# Patient Record
Sex: Male | Born: 1945 | Race: Black or African American | Hispanic: No | Marital: Married | Smoking: Never smoker
Health system: Southern US, Community
[De-identification: ages and names within clinical notes are randomized; demographics above are authoritative.]

## PROBLEM LIST (undated history)

## (undated) DIAGNOSIS — I10 Essential (primary) hypertension: Secondary | ICD-10-CM

---

## 2001-10-29 ENCOUNTER — Ambulatory Visit (HOSPITAL_COMMUNITY): Admission: RE | Admit: 2001-10-29 | Discharge: 2001-10-29 | Payer: Self-pay | Admitting: Internal Medicine

## 2001-10-29 ENCOUNTER — Encounter: Payer: Self-pay | Admitting: Internal Medicine

## 2002-01-07 ENCOUNTER — Ambulatory Visit (HOSPITAL_COMMUNITY): Admission: RE | Admit: 2002-01-07 | Discharge: 2002-01-07 | Payer: Self-pay | Admitting: Specialist

## 2002-02-23 ENCOUNTER — Ambulatory Visit (HOSPITAL_COMMUNITY): Admission: RE | Admit: 2002-02-23 | Discharge: 2002-02-23 | Payer: Self-pay | Admitting: Specialist

## 2012-05-23 ENCOUNTER — Encounter (HOSPITAL_COMMUNITY): Payer: Self-pay | Admitting: *Deleted

## 2012-05-23 ENCOUNTER — Emergency Department (HOSPITAL_COMMUNITY)
Admission: EM | Admit: 2012-05-23 | Discharge: 2012-05-23 | Disposition: A | Discharge status: Home / Self Care | Payer: BC Managed Care – PPO | Attending: Emergency Medicine | Admitting: Emergency Medicine

## 2012-05-23 DIAGNOSIS — M109 Gout, unspecified: Secondary | ICD-10-CM

## 2012-05-23 DIAGNOSIS — M79609 Pain in unspecified limb: Secondary | ICD-10-CM | POA: Insufficient documentation

## 2012-05-23 HISTORY — DX: Essential (primary) hypertension: I10

## 2012-05-23 MED ORDER — PREDNISONE 20 MG PO TABS: 20.0000 mg | ORAL_TABLET | Freq: Two times a day (BID) | ORAL | Status: DC

## 2012-05-23 MED ORDER — PREDNISONE 20 MG PO TABS
60.0000 mg | ORAL_TABLET | Freq: Once | ORAL | Status: AC
  Administered 2012-05-23: 60 mg via ORAL
  Filled 2012-05-23: qty 3

## 2012-05-23 NOTE — ED Notes (Signed)
C/o lt great toe pain for 2 days.  No known injury

## 2012-05-23 NOTE — ED Provider Notes (Signed)
History   This chart was scribed for No att. providers found by Toya Smothers. The patient was seen in room TR10C/TR10C. Patient's care was started at 1751.  CSN: [id number]  Arrival date & time 05/23/12  1751   None     Chief Complaint  Patient presents with  . Toe Pain   The history is provided by the patient. No language interpreter was used.    CULVER FEIGHNER is a 66 y.o. male with a h/oHTN who presents to the Emergency Department complaining of 2 days of sever constant sharp great right toe pain without injury mechanism. Pain is aggravated with ambulation and palpation, while alleviated by nothing. Pt denotes associate moderate swelling. No foreign body present. No obvious deformity. PTA pt has treated symptoms with cold and hot compress providing mild relief. Pt denies chest pain, chest tightness, dizziness, HA, and fever. Pt admits the use of alcohol and denies the use of tobacco products.   Past Medical History  Diagnosis Date  . Hypertension    History reviewed. No pertinent past surgical history.  No family history on file.  History  Substance Use Topics  . Smoking status: Never Smoker   . Smokeless tobacco: Not on file  . Alcohol Use: Yes   Review of Systems  Musculoskeletal:       Extremity pain  All other systems reviewed and are negative.    Allergies  Review of patient's allergies indicates no known allergies.  Home Medications   Current Outpatient Rx  Name Route Sig Dispense Refill  . OMEGA-3 FATTY ACIDS 1000 MG PO CAPS Oral Take 1 g by mouth daily.    Marland Kitchen LISINOPRIL 20 MG PO TABS Oral Take 20 mg by mouth 2 (two) times daily.    Marland Kitchen PREDNISONE 20 MG PO TABS Oral Take 1 tablet (20 mg total) by mouth 2 (two) times daily. 10 tablet 0    BP 140/81  Pulse 106  Temp 99.1 F (37.3 C) (Oral)  Resp 18  SpO2 96%  Physical Exam  Nursing note and vitals reviewed. Constitutional: He is oriented to person, place, and time. He appears well-developed and  well-nourished. No distress.  HENT:  Head: Normocephalic and atraumatic.  Eyes: Conjunctivae normal and EOM are normal.  Neck: Neck supple. No tracheal deviation present.  Cardiovascular: Normal rate.   Pulmonary/Chest: Effort normal. No respiratory distress.  Abdominal: He exhibits no distension.  Musculoskeletal: Normal range of motion.  Neurological: He is alert and oriented to person, place, and time. No sensory deficit.  Skin: Skin is dry.  Psychiatric: He has a normal mood and affect. His behavior is normal.    ED Course  Procedures 19:28- Evaluated Pt. Pt is awake, alert, and oriented. 19:33- Patient informed of clinical course, understand medical decision-making process, and agree with plan. Plan: Home Medications- Perdnisone ; Home Treatments- cold compress and follow dietary regulations; Recommended follow up- with PCP to discuss long term treatment plan.   Labs Reviewed - No data to display No results found.   1. Gout       MDM  Evaluation is consistent with gout. Doubt septic arthritis, cellulitis, or lymphangitis.     I personally performed the services described in this documentation, which was scribed in my presence. The recorded information has been reviewed and considered.      Flint Melter, MD 05/29/12 [phone]

## 2020-09-09 DIAGNOSIS — I10 Essential (primary) hypertension: Secondary | ICD-10-CM | POA: Diagnosis not present

## 2020-09-09 DIAGNOSIS — E782 Mixed hyperlipidemia: Secondary | ICD-10-CM | POA: Diagnosis not present

## 2020-09-09 DIAGNOSIS — E119 Type 2 diabetes mellitus without complications: Secondary | ICD-10-CM | POA: Diagnosis not present

## 2020-09-09 DIAGNOSIS — E1169 Type 2 diabetes mellitus with other specified complication: Secondary | ICD-10-CM | POA: Diagnosis not present

## 2020-10-19 DIAGNOSIS — E119 Type 2 diabetes mellitus without complications: Secondary | ICD-10-CM | POA: Diagnosis not present

## 2020-10-19 DIAGNOSIS — E1169 Type 2 diabetes mellitus with other specified complication: Secondary | ICD-10-CM | POA: Diagnosis not present

## 2020-10-19 DIAGNOSIS — I10 Essential (primary) hypertension: Secondary | ICD-10-CM | POA: Diagnosis not present

## 2020-10-19 DIAGNOSIS — E782 Mixed hyperlipidemia: Secondary | ICD-10-CM | POA: Diagnosis not present

## 2020-11-30 DIAGNOSIS — E782 Mixed hyperlipidemia: Secondary | ICD-10-CM | POA: Diagnosis not present

## 2020-11-30 DIAGNOSIS — I10 Essential (primary) hypertension: Secondary | ICD-10-CM | POA: Diagnosis not present

## 2020-11-30 DIAGNOSIS — E119 Type 2 diabetes mellitus without complications: Secondary | ICD-10-CM | POA: Diagnosis not present

## 2020-11-30 DIAGNOSIS — E1169 Type 2 diabetes mellitus with other specified complication: Secondary | ICD-10-CM | POA: Diagnosis not present

## 2020-12-20 DIAGNOSIS — E782 Mixed hyperlipidemia: Secondary | ICD-10-CM | POA: Diagnosis not present

## 2020-12-20 DIAGNOSIS — Z0000 Encounter for general adult medical examination without abnormal findings: Secondary | ICD-10-CM | POA: Diagnosis not present

## 2020-12-20 DIAGNOSIS — E1169 Type 2 diabetes mellitus with other specified complication: Secondary | ICD-10-CM | POA: Diagnosis not present

## 2020-12-20 DIAGNOSIS — M109 Gout, unspecified: Secondary | ICD-10-CM | POA: Diagnosis not present

## 2020-12-20 DIAGNOSIS — I10 Essential (primary) hypertension: Secondary | ICD-10-CM | POA: Diagnosis not present

## 2020-12-20 DIAGNOSIS — Z1389 Encounter for screening for other disorder: Secondary | ICD-10-CM | POA: Diagnosis not present

## 2021-01-03 DIAGNOSIS — I10 Essential (primary) hypertension: Secondary | ICD-10-CM | POA: Diagnosis not present

## 2021-01-27 DIAGNOSIS — E1169 Type 2 diabetes mellitus with other specified complication: Secondary | ICD-10-CM | POA: Diagnosis not present

## 2021-01-27 DIAGNOSIS — I10 Essential (primary) hypertension: Secondary | ICD-10-CM | POA: Diagnosis not present

## 2021-01-27 DIAGNOSIS — E119 Type 2 diabetes mellitus without complications: Secondary | ICD-10-CM | POA: Diagnosis not present

## 2021-01-27 DIAGNOSIS — E782 Mixed hyperlipidemia: Secondary | ICD-10-CM | POA: Diagnosis not present

## 2021-03-27 DIAGNOSIS — I10 Essential (primary) hypertension: Secondary | ICD-10-CM | POA: Diagnosis not present

## 2021-03-27 DIAGNOSIS — E1169 Type 2 diabetes mellitus with other specified complication: Secondary | ICD-10-CM | POA: Diagnosis not present

## 2021-03-27 DIAGNOSIS — M109 Gout, unspecified: Secondary | ICD-10-CM | POA: Diagnosis not present

## 2021-03-27 DIAGNOSIS — E782 Mixed hyperlipidemia: Secondary | ICD-10-CM | POA: Diagnosis not present

## 2021-04-03 DIAGNOSIS — E782 Mixed hyperlipidemia: Secondary | ICD-10-CM | POA: Diagnosis not present

## 2021-04-03 DIAGNOSIS — E1169 Type 2 diabetes mellitus with other specified complication: Secondary | ICD-10-CM | POA: Diagnosis not present

## 2021-04-03 DIAGNOSIS — E119 Type 2 diabetes mellitus without complications: Secondary | ICD-10-CM | POA: Diagnosis not present

## 2021-04-03 DIAGNOSIS — I10 Essential (primary) hypertension: Secondary | ICD-10-CM | POA: Diagnosis not present

## 2021-04-25 DIAGNOSIS — I10 Essential (primary) hypertension: Secondary | ICD-10-CM | POA: Diagnosis not present

## 2021-04-25 DIAGNOSIS — R059 Cough, unspecified: Secondary | ICD-10-CM | POA: Diagnosis not present

## 2021-04-25 DIAGNOSIS — R011 Cardiac murmur, unspecified: Secondary | ICD-10-CM | POA: Diagnosis not present

## 2021-04-26 ENCOUNTER — Other Ambulatory Visit: Payer: Self-pay | Admitting: Internal Medicine

## 2021-04-26 ENCOUNTER — Ambulatory Visit
Admission: RE | Admit: 2021-04-26 | Discharge: 2021-04-26 | Disposition: A | Discharge status: Home / Self Care | Payer: Self-pay | Source: Ambulatory Visit | Attending: Internal Medicine | Admitting: Internal Medicine

## 2021-04-26 DIAGNOSIS — R059 Cough, unspecified: Secondary | ICD-10-CM | POA: Diagnosis not present

## 2021-04-26 DIAGNOSIS — R0602 Shortness of breath: Secondary | ICD-10-CM | POA: Diagnosis not present

## 2021-04-26 IMAGING — DX DG CHEST 2V
2 series · 2 of 2 positions shown · non-contrast
Comparison: [DATE]
COMPARISON: [DATE].
COMPARISON: [DATE]

Addendum:
CLINICAL DATA: Cough, shortness of breath with exertion

EXAM:
CHEST - 2 VIEW
CLINICAL DATA: Cough, shortness of breath.
[UH] view

[dg chest 2 view (1 of 2)]
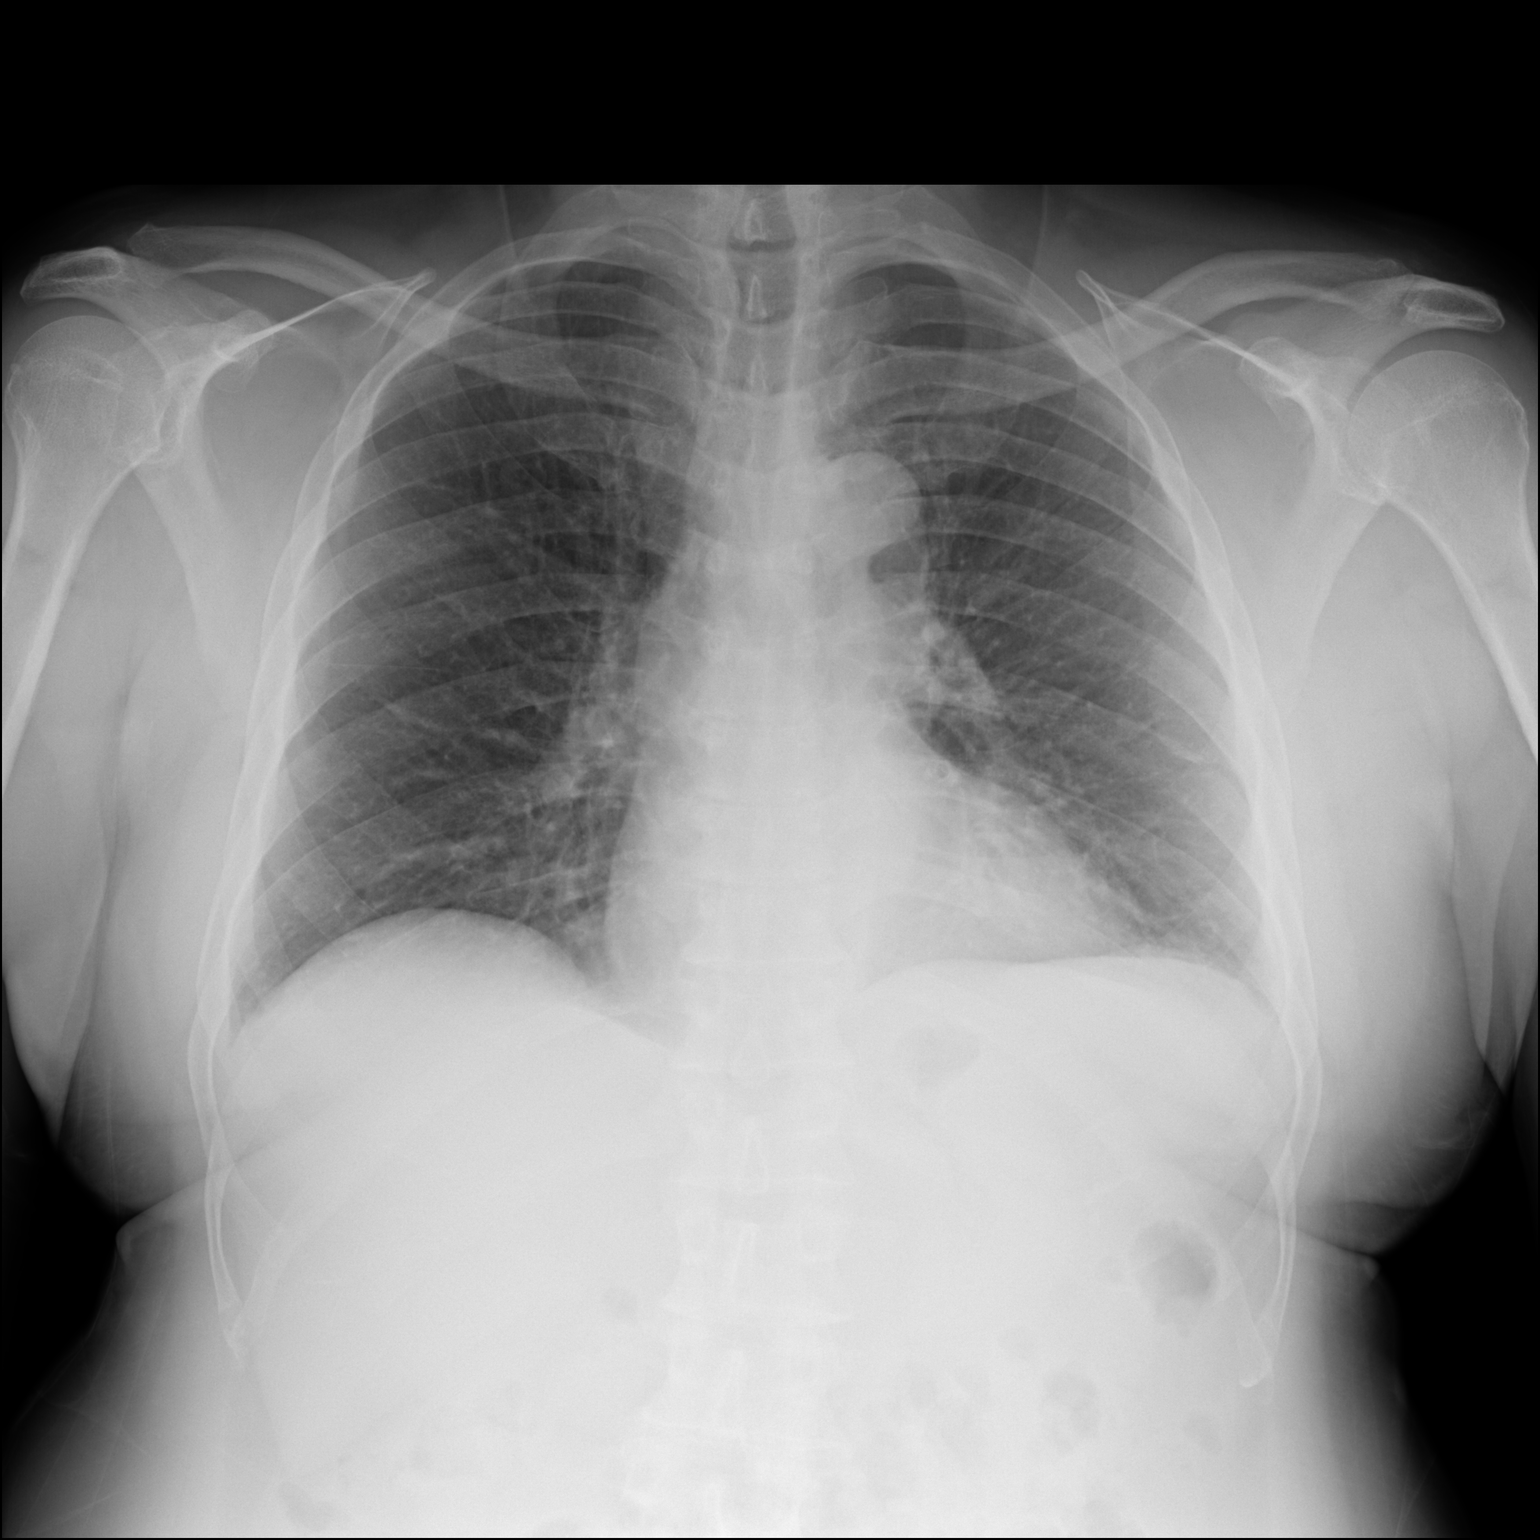

[dg chest 2 view (2 of 2)]
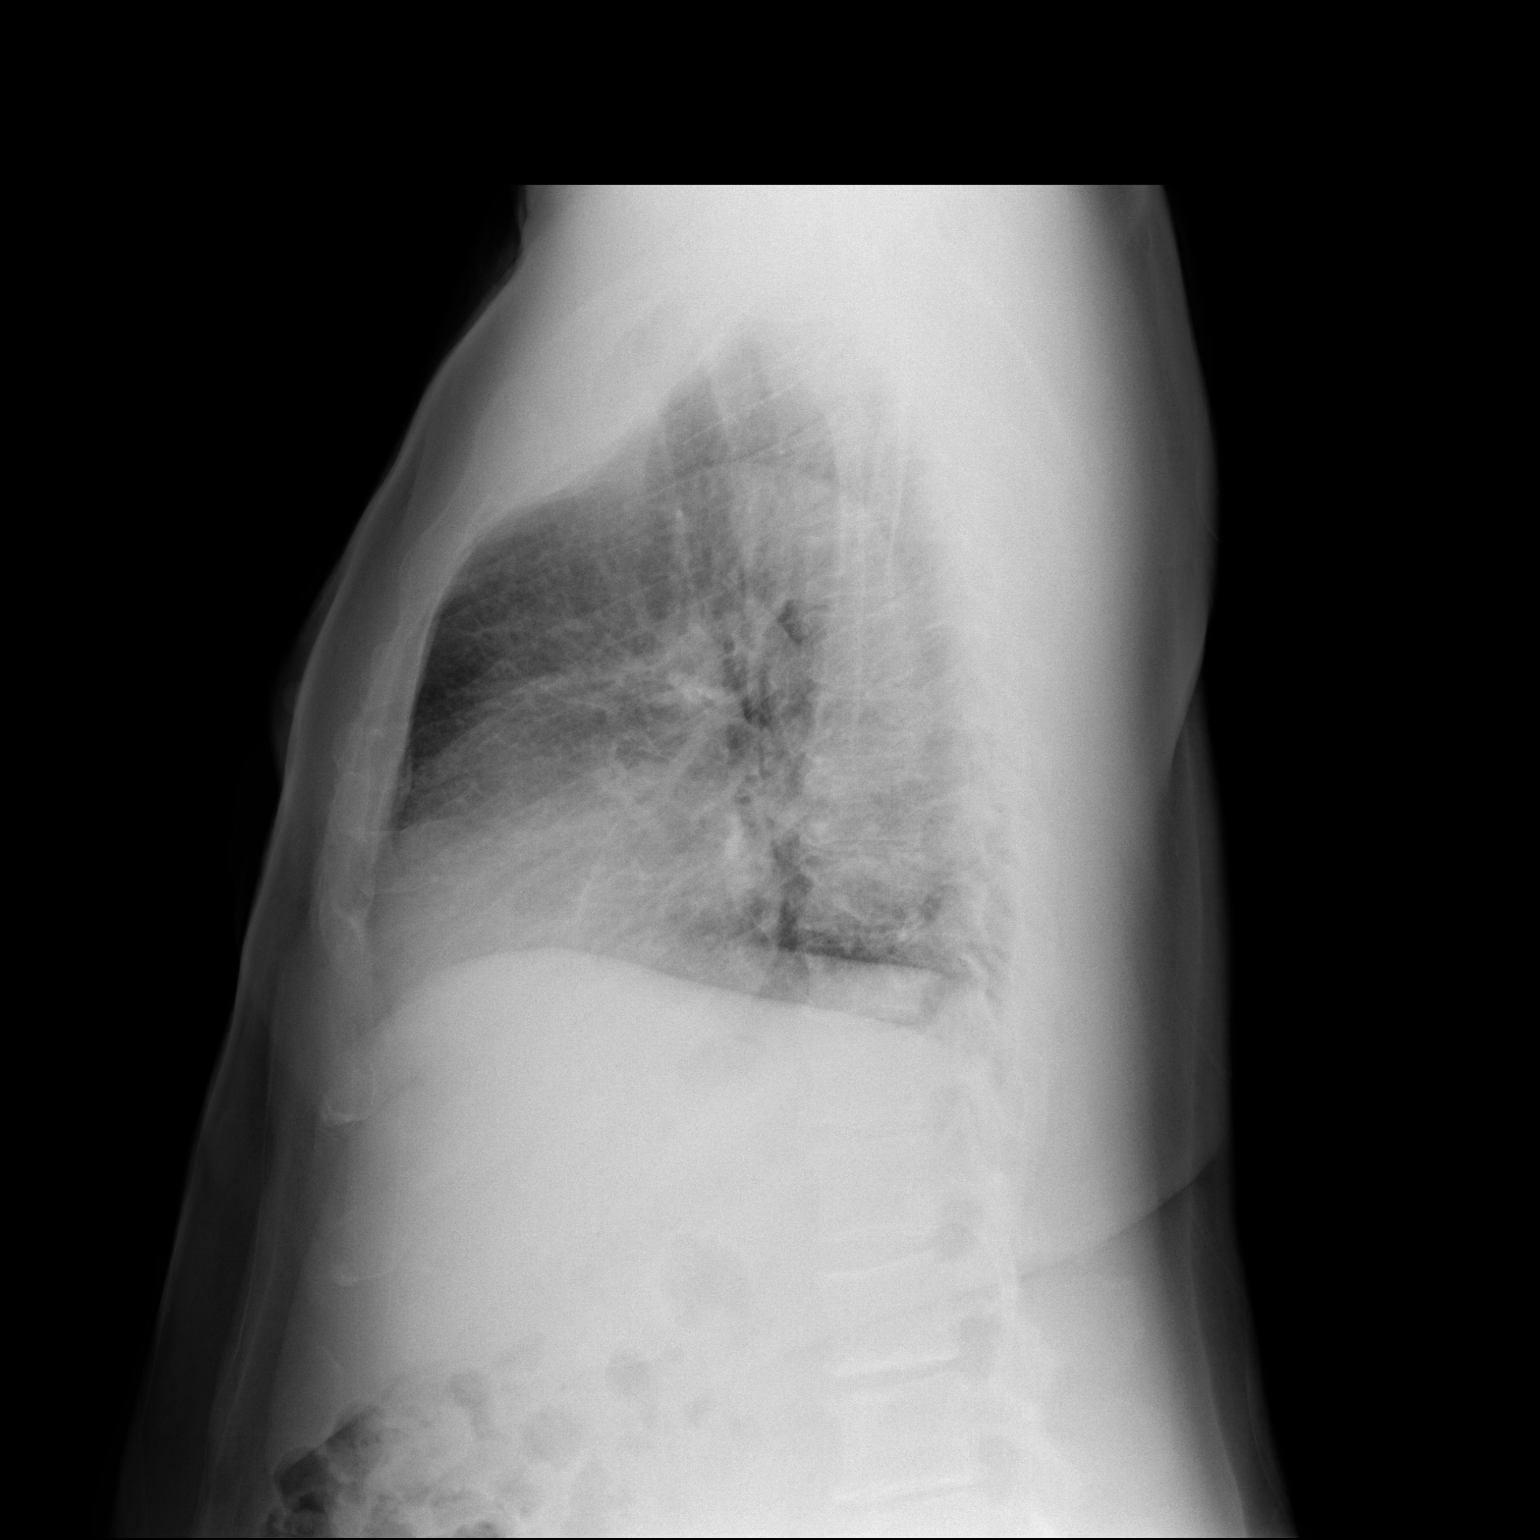

[2 of 2 positions shown; findings below may reference images not displayed]

FINDINGS: Mild cardiomegaly. Mild peribronchial thickening. No confluent
opacities or effusions. No acute bony abnormality.
IMPRESSION: Mild cardiomegaly.

Bronchitic changes.

ADDENDUM:
The original images on this report were for the wrong patient. The
correct patient and images are now included.
FINDINGS: Heart is normal size. No confluent airspace opacities or effusions.
No acute bony abnormality.
IMPRESSION: No acute cardiopulmonary disease.

*** End of Addendum ***
FINDINGS: Mild cardiomegaly. Mild peribronchial thickening. No confluent
opacities or effusions. No acute bony abnormality.
IMPRESSION: Mild cardiomegaly.

Bronchitic changes.

## 2021-04-28 DIAGNOSIS — R011 Cardiac murmur, unspecified: Secondary | ICD-10-CM | POA: Diagnosis not present

## 2021-06-05 DIAGNOSIS — I10 Essential (primary) hypertension: Secondary | ICD-10-CM | POA: Diagnosis not present

## 2021-06-05 DIAGNOSIS — E782 Mixed hyperlipidemia: Secondary | ICD-10-CM | POA: Diagnosis not present

## 2021-06-05 DIAGNOSIS — E119 Type 2 diabetes mellitus without complications: Secondary | ICD-10-CM | POA: Diagnosis not present

## 2021-06-05 DIAGNOSIS — E1169 Type 2 diabetes mellitus with other specified complication: Secondary | ICD-10-CM | POA: Diagnosis not present

## 2021-08-30 DIAGNOSIS — I739 Peripheral vascular disease, unspecified: Secondary | ICD-10-CM | POA: Diagnosis not present

## 2021-08-30 DIAGNOSIS — I10 Essential (primary) hypertension: Secondary | ICD-10-CM | POA: Diagnosis not present

## 2021-08-30 DIAGNOSIS — I350 Nonrheumatic aortic (valve) stenosis: Secondary | ICD-10-CM | POA: Diagnosis not present

## 2021-08-31 ENCOUNTER — Other Ambulatory Visit: Payer: Self-pay | Admitting: Internal Medicine

## 2021-08-31 DIAGNOSIS — I739 Peripheral vascular disease, unspecified: Secondary | ICD-10-CM

## 2021-09-06 DIAGNOSIS — Q231 Congenital insufficiency of aortic valve: Secondary | ICD-10-CM | POA: Diagnosis not present

## 2021-09-06 DIAGNOSIS — I10 Essential (primary) hypertension: Secondary | ICD-10-CM | POA: Diagnosis not present

## 2021-09-08 ENCOUNTER — Ambulatory Visit
Admission: RE | Admit: 2021-09-08 | Discharge: 2021-09-08 | Disposition: A | Discharge status: Home / Self Care | Payer: Medicare Other | Source: Ambulatory Visit | Attending: Internal Medicine | Admitting: Internal Medicine

## 2021-09-08 DIAGNOSIS — I739 Peripheral vascular disease, unspecified: Secondary | ICD-10-CM

## 2021-09-24 NOTE — Progress Notes (Signed)
Cardiology Office Note:    Date:  09/26/2021   ID:  Patrick Stevens, DOB May 21, 1946, MRN [id number]  PCP:  Georgann Housekeeper, MD  Cardiologist:  Little Ishikawa, MD  Electrophysiologist:  None   Referring MD: Georgann Housekeeper, MD   Chief Complaint  Patient presents with   Aortic Stenosis    History of Present Illness:    Patrick Stevens is a 76 y.o. male with a hx of hypertension who is referred by Dr. Donette Larry for evaluation of aortic stenosis/bicuspid aortic valve.  Had echo done last year for heart murmur, reportedly with bicuspid aortic valve and mild aortic stenosis.  Denies any chest pain, dyspnea, lightheadedness, syncope, or palpitations.  Does report some swelling in his legs when he is on his feet all day.  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  Denies any exertional symptoms.  No smoking history.  No known heart disease in his family.  He is originally from [place], moved to [place] in 1990.    Past Medical History:  Diagnosis Date   Hypertension     No past surgical history on file.  Current Medications: Current Meds  Medication Sig   aspirin EC 81 MG tablet Take 81 mg by mouth daily. Swallow whole.   indomethacin (INDOCIN) 50 MG capsule Take 50 mg by mouth 2 (two) times daily as needed (gout pain).   lovastatin (MEVACOR) 40 MG tablet Take 40 mg by mouth daily with supper.   metFORMIN (GLUCOPHAGE) 500 MG tablet Take 500 mg by mouth daily with supper.   valsartan-hydrochlorothiazide (DIOVAN-HCT) 160-12.5 MG tablet Take 1 tablet by mouth in the morning and at bedtime.     Allergies:   Patient has no known allergies.   Social History   Socioeconomic History   Marital status: Married    Spouse name: Not on file   Number of children: Not on file   Years of education: Not on file   Highest education level: Not on file  Occupational History   Not on file  Tobacco Use   Smoking status: Never   Smokeless tobacco: Not on  file  Substance and Sexual Activity   Alcohol use: Yes   Drug use: Not on file   Sexual activity: Not on file  Other Topics Concern   Not on file  Social History Narrative   Not on file   Social Determinants of Health   Financial Resource Strain: Not on file  Food Insecurity: Not on file  Transportation Needs: Not on file  Physical Activity: Not on file  Stress: Not on file  Social Connections: Not on file     Family History: The patient's family history is not on file.  ROS:   Please see the history of present illness.     All other systems reviewed and are negative.  EKGs/Labs/Other Studies Reviewed:    The following studies were reviewed today:   EKG:   09/26/2021: Normal sinus rhythm, rate 87, no ST abnormalities  Recent Labs: No results found for requested labs within last 8760 hours.  Recent Lipid Panel No results found for: CHOL, TRIG, HDL, CHOLHDL, VLDL, LDLCALC, LDLDIRECT  Physical Exam:    VS:  BP 130/60    Pulse 87    Ht 5\' 4"  (1.626 m)    Wt 178 lb (80.7 kg)    SpO2 99%    BMI 30.55 kg/m     Wt Readings from Last  3 Encounters:  09/26/21 178 lb (80.7 kg)     GEN:  Well nourished, well developed in no acute distress HEENT: Normal NECK: No JVD; No carotid bruits LYMPHATICS: No lymphadenopathy CARDIAC: RRR, 2 out of 6 systolic murmur  RESPIRATORY:  Clear to auscultation without rales, wheezing or rhonchi  ABDOMEN: Soft, non-tender, non-distended MUSCULOSKELETAL:  No edema; No deformity  SKIN: Warm and dry NEUROLOGIC:  Alert and oriented x 3 PSYCHIATRIC:  Normal affect   ASSESSMENT:    1. Aortic valve stenosis, etiology of cardiac valve disease unspecified   2. Essential hypertension   3. Hyperlipidemia, unspecified hyperlipidemia type   4. Prediabetes    PLAN:    Aortic stenosis: Echocardiogram done last year reportedly with bicuspid aortic valve and mild AS. -Repeat echocardiogram  Hypertension: On valsartan-HCTZ 160-12.5 mg daily.   Appears controlled  Hyperlipidemia: On lovastatin.  LDL 114 on 12/20/2020  Prediabetes: On metformin.  A1c 6.3% 12/20/2020  RTC in 6 months   Medication Adjustments/Labs and Tests Ordered: Current medicines are reviewed at length with the patient today.  Concerns regarding medicines are outlined above.  Orders Placed This Encounter  Procedures   EKG 12-Lead   ECHOCARDIOGRAM COMPLETE   No orders of the defined types were placed in this encounter.   Patient Instructions  Medication Instructions:  Your physician recommends that you continue on your current medications as directed. Please refer to the Current Medication list given to you today.  *If you need a refill on your cardiac medications before your next appointment, please call your pharmacy*   Lab Work: None If you have labs (blood work) drawn today and your tests are completely normal, you will receive your results only by: MyChart Message (if you have MyChart) OR A paper copy in the mail If you have any lab test that is abnormal or we need to change your treatment, we will call you to review the results.   Testing/Procedures: Your physician has requested that you have an echocardiogram. Echocardiography is a painless test that uses sound waves to create images of your heart. It provides your doctor with information about the size and shape of your heart and how well your hearts chambers and valves are working. This procedure takes approximately one hour. There are no restrictions for this procedure.    Follow-Up: At [facility], you and your health needs are our priority.  As part of our continuing mission to provide you with exceptional heart care, we have created designated Provider Care Teams.  These Care Teams include your primary Cardiologist (physician) and Advanced Practice Providers (APPs -  Physician Assistants and Nurse Practitioners) who all work together to provide you with the care you need, when you need  it.  We recommend signing up for the patient portal called "MyChart".  Sign up information is provided on this After Visit Summary.  MyChart is used to connect with patients for Virtual Visits (Telemedicine).  Patients are able to view lab/test results, encounter notes, upcoming appointments, etc.  Non-urgent messages can be sent to your provider as well.   To learn more about what you can do with MyChart, go to [email or web address].    Your next appointment:   6 month(s)  The format for your next appointment:   In Person  Provider:   Little Ishikawa, MD     Other Instructions     Signed, Little Ishikawa, MD  09/26/2021 8:41 AM    [facility]

## 2021-09-26 ENCOUNTER — Encounter: Payer: Self-pay | Admitting: Cardiology

## 2021-09-26 ENCOUNTER — Ambulatory Visit (INDEPENDENT_AMBULATORY_CARE_PROVIDER_SITE_OTHER): Payer: Medicare Other | Admitting: Cardiology

## 2021-09-26 ENCOUNTER — Other Ambulatory Visit: Payer: Self-pay

## 2021-09-26 VITALS — BP 130/60 | HR 87 | Ht 64.0 in | Wt 178.0 lb

## 2021-09-26 DIAGNOSIS — E785 Hyperlipidemia, unspecified: Secondary | ICD-10-CM

## 2021-09-26 DIAGNOSIS — R7303 Prediabetes: Secondary | ICD-10-CM | POA: Diagnosis not present

## 2021-09-26 DIAGNOSIS — I350 Nonrheumatic aortic (valve) stenosis: Secondary | ICD-10-CM

## 2021-09-26 DIAGNOSIS — I10 Essential (primary) hypertension: Secondary | ICD-10-CM | POA: Diagnosis not present

## 2021-09-26 NOTE — Patient Instructions (Signed)
Medication Instructions:  Your physician recommends that you continue on your current medications as directed. Please refer to the Current Medication list given to you today.  *If you need a refill on your cardiac medications before your next appointment, please call your pharmacy*   Lab Work: None If you have labs (blood work) drawn today and your tests are completely normal, you will receive your results only by: MyChart Message (if you have MyChart) OR A paper copy in the mail If you have any lab test that is abnormal or we need to change your treatment, we will call you to review the results.   Testing/Procedures: Your physician has requested that you have an echocardiogram. Echocardiography is a painless test that uses sound waves to create images of your heart. It provides your doctor with information about the size and shape of your heart and how well your hearts chambers and valves are working. This procedure takes approximately one hour. There are no restrictions for this procedure.    Follow-Up: At [facility], you and your health needs are our priority.  As part of our continuing mission to provide you with exceptional heart care, we have created designated Provider Care Teams.  These Care Teams include your primary Cardiologist (physician) and Advanced Practice Providers (APPs -  Physician Assistants and Nurse Practitioners) who all work together to provide you with the care you need, when you need it.  We recommend signing up for the patient portal called "MyChart".  Sign up information is provided on this After Visit Summary.  MyChart is used to connect with patients for Virtual Visits (Telemedicine).  Patients are able to view lab/test results, encounter notes, upcoming appointments, etc.  Non-urgent messages can be sent to your provider as well.   To learn more about what you can do with MyChart, go to [email or web address].    Your next appointment:   6 month(s)  The  format for your next appointment:   In Person  Provider:   Little Ishikawa, MD     Other Instructions

## 2021-10-06 DIAGNOSIS — I10 Essential (primary) hypertension: Secondary | ICD-10-CM | POA: Diagnosis not present

## 2021-10-06 DIAGNOSIS — I739 Peripheral vascular disease, unspecified: Secondary | ICD-10-CM | POA: Diagnosis not present

## 2021-10-11 ENCOUNTER — Ambulatory Visit (HOSPITAL_COMMUNITY): Payer: Medicare Other

## 2021-11-08 ENCOUNTER — Ambulatory Visit (HOSPITAL_COMMUNITY): Payer: Medicare Other | Attending: Internal Medicine

## 2021-11-08 ENCOUNTER — Encounter: Payer: Self-pay | Admitting: Cardiology

## 2021-11-08 NOTE — Progress Notes (Unsigned)
Patient ID: Patrick Stevens, male   DOB: [birth year], 76 y.o.   MRN: [id number] ? ? ?Verified appointment "no show" status with Wallace Cullens at 8:31am. ? ?

## 2021-11-29 DIAGNOSIS — M109 Gout, unspecified: Secondary | ICD-10-CM | POA: Diagnosis not present

## 2021-11-29 DIAGNOSIS — R21 Rash and other nonspecific skin eruption: Secondary | ICD-10-CM | POA: Diagnosis not present

## 2021-11-29 DIAGNOSIS — I10 Essential (primary) hypertension: Secondary | ICD-10-CM | POA: Diagnosis not present

## 2021-11-30 ENCOUNTER — Ambulatory Visit (HOSPITAL_COMMUNITY): Payer: Medicare Other | Attending: Cardiology

## 2021-11-30 ENCOUNTER — Encounter (HOSPITAL_COMMUNITY): Payer: Self-pay | Admitting: Cardiology

## 2022-01-11 DIAGNOSIS — E1122 Type 2 diabetes mellitus with diabetic chronic kidney disease: Secondary | ICD-10-CM | POA: Diagnosis not present

## 2022-01-11 DIAGNOSIS — I350 Nonrheumatic aortic (valve) stenosis: Secondary | ICD-10-CM | POA: Diagnosis not present

## 2022-01-11 DIAGNOSIS — I739 Peripheral vascular disease, unspecified: Secondary | ICD-10-CM | POA: Diagnosis not present

## 2022-01-11 DIAGNOSIS — M109 Gout, unspecified: Secondary | ICD-10-CM | POA: Diagnosis not present

## 2022-01-11 DIAGNOSIS — E782 Mixed hyperlipidemia: Secondary | ICD-10-CM | POA: Diagnosis not present

## 2022-01-11 DIAGNOSIS — I10 Essential (primary) hypertension: Secondary | ICD-10-CM | POA: Diagnosis not present

## 2022-01-11 DIAGNOSIS — N1831 Chronic kidney disease, stage 3a: Secondary | ICD-10-CM | POA: Diagnosis not present

## 2022-01-11 DIAGNOSIS — Z0000 Encounter for general adult medical examination without abnormal findings: Secondary | ICD-10-CM | POA: Diagnosis not present

## 2022-01-11 DIAGNOSIS — Q231 Congenital insufficiency of aortic valve: Secondary | ICD-10-CM | POA: Diagnosis not present

## 2022-06-28 DIAGNOSIS — M109 Gout, unspecified: Secondary | ICD-10-CM | POA: Diagnosis not present

## 2022-06-28 DIAGNOSIS — E1169 Type 2 diabetes mellitus with other specified complication: Secondary | ICD-10-CM | POA: Diagnosis not present

## 2022-08-17 DIAGNOSIS — I350 Nonrheumatic aortic (valve) stenosis: Secondary | ICD-10-CM | POA: Diagnosis not present

## 2022-08-17 DIAGNOSIS — E1122 Type 2 diabetes mellitus with diabetic chronic kidney disease: Secondary | ICD-10-CM | POA: Diagnosis not present

## 2022-08-17 DIAGNOSIS — I10 Essential (primary) hypertension: Secondary | ICD-10-CM | POA: Diagnosis not present

## 2022-08-17 DIAGNOSIS — M109 Gout, unspecified: Secondary | ICD-10-CM | POA: Diagnosis not present

## 2022-08-17 DIAGNOSIS — Q231 Congenital insufficiency of aortic valve: Secondary | ICD-10-CM | POA: Diagnosis not present

## 2022-08-17 DIAGNOSIS — N1831 Chronic kidney disease, stage 3a: Secondary | ICD-10-CM | POA: Diagnosis not present

## 2022-08-17 DIAGNOSIS — I739 Peripheral vascular disease, unspecified: Secondary | ICD-10-CM | POA: Diagnosis not present

## 2023-02-07 DIAGNOSIS — Z0000 Encounter for general adult medical examination without abnormal findings: Secondary | ICD-10-CM | POA: Diagnosis not present

## 2023-02-07 DIAGNOSIS — I350 Nonrheumatic aortic (valve) stenosis: Secondary | ICD-10-CM | POA: Diagnosis not present

## 2023-02-07 DIAGNOSIS — M109 Gout, unspecified: Secondary | ICD-10-CM | POA: Diagnosis not present

## 2023-02-07 DIAGNOSIS — E1151 Type 2 diabetes mellitus with diabetic peripheral angiopathy without gangrene: Secondary | ICD-10-CM | POA: Diagnosis not present

## 2023-02-07 DIAGNOSIS — E1122 Type 2 diabetes mellitus with diabetic chronic kidney disease: Secondary | ICD-10-CM | POA: Diagnosis not present

## 2023-02-07 DIAGNOSIS — Q231 Congenital insufficiency of aortic valve: Secondary | ICD-10-CM | POA: Diagnosis not present

## 2023-02-07 DIAGNOSIS — N182 Chronic kidney disease, stage 2 (mild): Secondary | ICD-10-CM | POA: Diagnosis not present

## 2023-02-07 DIAGNOSIS — I10 Essential (primary) hypertension: Secondary | ICD-10-CM | POA: Diagnosis not present

## 2023-02-07 DIAGNOSIS — E782 Mixed hyperlipidemia: Secondary | ICD-10-CM | POA: Diagnosis not present

## 2023-03-08 DIAGNOSIS — I10 Essential (primary) hypertension: Secondary | ICD-10-CM | POA: Diagnosis not present

## 2023-03-08 DIAGNOSIS — E1122 Type 2 diabetes mellitus with diabetic chronic kidney disease: Secondary | ICD-10-CM | POA: Diagnosis not present

## 2023-04-22 DIAGNOSIS — I10 Essential (primary) hypertension: Secondary | ICD-10-CM | POA: Diagnosis not present

## 2023-04-22 DIAGNOSIS — E119 Type 2 diabetes mellitus without complications: Secondary | ICD-10-CM | POA: Diagnosis not present

## 2023-05-28 DIAGNOSIS — U071 COVID-19: Secondary | ICD-10-CM | POA: Diagnosis not present

## 2023-05-28 DIAGNOSIS — E119 Type 2 diabetes mellitus without complications: Secondary | ICD-10-CM | POA: Diagnosis not present

## 2023-05-28 DIAGNOSIS — J069 Acute upper respiratory infection, unspecified: Secondary | ICD-10-CM | POA: Diagnosis not present

## 2023-12-19 DIAGNOSIS — E782 Mixed hyperlipidemia: Secondary | ICD-10-CM | POA: Diagnosis not present

## 2023-12-19 DIAGNOSIS — E1122 Type 2 diabetes mellitus with diabetic chronic kidney disease: Secondary | ICD-10-CM | POA: Diagnosis not present

## 2023-12-19 DIAGNOSIS — N1831 Chronic kidney disease, stage 3a: Secondary | ICD-10-CM | POA: Diagnosis not present

## 2023-12-19 DIAGNOSIS — I10 Essential (primary) hypertension: Secondary | ICD-10-CM | POA: Diagnosis not present

## 2023-12-19 DIAGNOSIS — Z0000 Encounter for general adult medical examination without abnormal findings: Secondary | ICD-10-CM | POA: Diagnosis not present

## 2023-12-19 DIAGNOSIS — I350 Nonrheumatic aortic (valve) stenosis: Secondary | ICD-10-CM | POA: Diagnosis not present

## 2023-12-19 DIAGNOSIS — Z23 Encounter for immunization: Secondary | ICD-10-CM | POA: Diagnosis not present

## 2023-12-19 DIAGNOSIS — M109 Gout, unspecified: Secondary | ICD-10-CM | POA: Diagnosis not present

## 2023-12-31 DIAGNOSIS — Z1211 Encounter for screening for malignant neoplasm of colon: Secondary | ICD-10-CM | POA: Diagnosis not present

## 2023-12-31 DIAGNOSIS — Z1212 Encounter for screening for malignant neoplasm of rectum: Secondary | ICD-10-CM | POA: Diagnosis not present

## 2024-01-11 DIAGNOSIS — I10 Essential (primary) hypertension: Secondary | ICD-10-CM | POA: Diagnosis not present

## 2024-01-11 DIAGNOSIS — I350 Nonrheumatic aortic (valve) stenosis: Secondary | ICD-10-CM | POA: Diagnosis not present

## 2024-01-11 DIAGNOSIS — E782 Mixed hyperlipidemia: Secondary | ICD-10-CM | POA: Diagnosis not present

## 2024-01-11 DIAGNOSIS — N182 Chronic kidney disease, stage 2 (mild): Secondary | ICD-10-CM | POA: Diagnosis not present

## 2024-01-29 DIAGNOSIS — I350 Nonrheumatic aortic (valve) stenosis: Secondary | ICD-10-CM | POA: Diagnosis not present

## 2024-02-10 DIAGNOSIS — E782 Mixed hyperlipidemia: Secondary | ICD-10-CM | POA: Diagnosis not present

## 2024-02-10 DIAGNOSIS — I10 Essential (primary) hypertension: Secondary | ICD-10-CM | POA: Diagnosis not present

## 2024-02-10 DIAGNOSIS — I350 Nonrheumatic aortic (valve) stenosis: Secondary | ICD-10-CM | POA: Diagnosis not present

## 2024-02-10 DIAGNOSIS — N182 Chronic kidney disease, stage 2 (mild): Secondary | ICD-10-CM | POA: Diagnosis not present

## 2024-03-12 DIAGNOSIS — I10 Essential (primary) hypertension: Secondary | ICD-10-CM | POA: Diagnosis not present

## 2024-03-12 DIAGNOSIS — M109 Gout, unspecified: Secondary | ICD-10-CM | POA: Diagnosis not present

## 2024-03-12 DIAGNOSIS — I350 Nonrheumatic aortic (valve) stenosis: Secondary | ICD-10-CM | POA: Diagnosis not present

## 2024-03-12 DIAGNOSIS — E782 Mixed hyperlipidemia: Secondary | ICD-10-CM | POA: Diagnosis not present

## 2024-03-12 DIAGNOSIS — N182 Chronic kidney disease, stage 2 (mild): Secondary | ICD-10-CM | POA: Diagnosis not present

## 2024-03-27 DIAGNOSIS — I10 Essential (primary) hypertension: Secondary | ICD-10-CM | POA: Diagnosis not present

## 2024-03-27 DIAGNOSIS — M109 Gout, unspecified: Secondary | ICD-10-CM | POA: Diagnosis not present

## 2024-04-07 DIAGNOSIS — M109 Gout, unspecified: Secondary | ICD-10-CM | POA: Diagnosis not present

## 2024-04-12 DIAGNOSIS — I10 Essential (primary) hypertension: Secondary | ICD-10-CM | POA: Diagnosis not present

## 2024-04-12 DIAGNOSIS — E782 Mixed hyperlipidemia: Secondary | ICD-10-CM | POA: Diagnosis not present

## 2024-04-12 DIAGNOSIS — I350 Nonrheumatic aortic (valve) stenosis: Secondary | ICD-10-CM | POA: Diagnosis not present

## 2024-04-12 DIAGNOSIS — N182 Chronic kidney disease, stage 2 (mild): Secondary | ICD-10-CM | POA: Diagnosis not present

## 2024-04-22 ENCOUNTER — Observation Stay (HOSPITAL_COMMUNITY)
Admission: EM | Admit: 2024-04-22 | Discharge: 2024-04-23 | Disposition: A | Discharge status: Home / Self Care | Attending: Internal Medicine | Admitting: Internal Medicine

## 2024-04-22 ENCOUNTER — Other Ambulatory Visit: Payer: Self-pay

## 2024-04-22 ENCOUNTER — Emergency Department (HOSPITAL_COMMUNITY)

## 2024-04-22 DIAGNOSIS — R5383 Other fatigue: Secondary | ICD-10-CM | POA: Diagnosis not present

## 2024-04-22 DIAGNOSIS — Z7984 Long term (current) use of oral hypoglycemic drugs: Secondary | ICD-10-CM | POA: Insufficient documentation

## 2024-04-22 DIAGNOSIS — E1165 Type 2 diabetes mellitus with hyperglycemia: Secondary | ICD-10-CM | POA: Diagnosis not present

## 2024-04-22 DIAGNOSIS — R011 Cardiac murmur, unspecified: Secondary | ICD-10-CM | POA: Diagnosis not present

## 2024-04-22 DIAGNOSIS — Z79899 Other long term (current) drug therapy: Secondary | ICD-10-CM | POA: Insufficient documentation

## 2024-04-22 DIAGNOSIS — R7989 Other specified abnormal findings of blood chemistry: Secondary | ICD-10-CM | POA: Insufficient documentation

## 2024-04-22 DIAGNOSIS — E1122 Type 2 diabetes mellitus with diabetic chronic kidney disease: Secondary | ICD-10-CM | POA: Diagnosis not present

## 2024-04-22 DIAGNOSIS — N179 Acute kidney failure, unspecified: Secondary | ICD-10-CM | POA: Diagnosis not present

## 2024-04-22 DIAGNOSIS — R778 Other specified abnormalities of plasma proteins: Secondary | ICD-10-CM | POA: Insufficient documentation

## 2024-04-22 DIAGNOSIS — Z7982 Long term (current) use of aspirin: Secondary | ICD-10-CM | POA: Diagnosis not present

## 2024-04-22 DIAGNOSIS — I10 Essential (primary) hypertension: Secondary | ICD-10-CM | POA: Diagnosis present

## 2024-04-22 DIAGNOSIS — Z794 Long term (current) use of insulin: Secondary | ICD-10-CM | POA: Insufficient documentation

## 2024-04-22 DIAGNOSIS — I129 Hypertensive chronic kidney disease with stage 1 through stage 4 chronic kidney disease, or unspecified chronic kidney disease: Secondary | ICD-10-CM | POA: Insufficient documentation

## 2024-04-22 DIAGNOSIS — R001 Bradycardia, unspecified: Secondary | ICD-10-CM | POA: Insufficient documentation

## 2024-04-22 DIAGNOSIS — N1831 Chronic kidney disease, stage 3a: Secondary | ICD-10-CM | POA: Insufficient documentation

## 2024-04-22 DIAGNOSIS — R350 Frequency of micturition: Secondary | ICD-10-CM | POA: Diagnosis not present

## 2024-04-22 DIAGNOSIS — R0602 Shortness of breath: Secondary | ICD-10-CM | POA: Insufficient documentation

## 2024-04-22 DIAGNOSIS — R42 Dizziness and giddiness: Secondary | ICD-10-CM | POA: Diagnosis not present

## 2024-04-22 DIAGNOSIS — E1100 Type 2 diabetes mellitus with hyperosmolarity without nonketotic hyperglycemic-hyperosmolar coma (NKHHC): Secondary | ICD-10-CM | POA: Diagnosis not present

## 2024-04-22 DIAGNOSIS — E1110 Type 2 diabetes mellitus with ketoacidosis without coma: Principal | ICD-10-CM | POA: Insufficient documentation

## 2024-04-22 DIAGNOSIS — E871 Hypo-osmolality and hyponatremia: Secondary | ICD-10-CM | POA: Diagnosis present

## 2024-04-22 DIAGNOSIS — E872 Acidosis, unspecified: Secondary | ICD-10-CM | POA: Diagnosis not present

## 2024-04-22 DIAGNOSIS — R739 Hyperglycemia, unspecified: Secondary | ICD-10-CM | POA: Diagnosis not present

## 2024-04-22 DIAGNOSIS — R000 Tachycardia, unspecified: Secondary | ICD-10-CM | POA: Diagnosis not present

## 2024-04-22 LAB — CBC WITH DIFFERENTIAL/PLATELET
Abs Immature Granulocytes: 0.02 K/uL (ref 0.00–0.07)
Basophils Absolute: 0 K/uL (ref 0.0–0.1)
Basophils Relative: 0 %
Eosinophils Absolute: 0.1 K/uL (ref 0.0–0.5)
Eosinophils Relative: 1 %
HCT: 47.2 % (ref 39.0–52.0)
Hemoglobin: 16 g/dL (ref 13.0–17.0)
Immature Granulocytes: 0 %
Lymphocytes Relative: 26 %
Lymphs Abs: 2.2 K/uL (ref 0.7–4.0)
MCH: 27.1 pg (ref 26.0–34.0)
MCHC: 33.9 g/dL (ref 30.0–36.0)
MCV: 79.9 fL — ABNORMAL LOW (ref 80.0–100.0)
Monocytes Absolute: 0.6 K/uL (ref 0.1–1.0)
Monocytes Relative: 6 %
Neutro Abs: 5.9 K/uL (ref 1.7–7.7)
Neutrophils Relative %: 67 %
Platelets: 225 K/uL (ref 150–400)
RBC: 5.91 MIL/uL — ABNORMAL HIGH (ref 4.22–5.81)
RDW: 13.7 % (ref 11.5–15.5)
WBC: 8.8 K/uL (ref 4.0–10.5)
nRBC: 0 % (ref 0.0–0.2)

## 2024-04-22 LAB — COMPREHENSIVE METABOLIC PANEL WITH GFR
ALT: 24 U/L (ref 0–44)
AST: 16 U/L (ref 15–41)
Albumin: 3.6 g/dL (ref 3.5–5.0)
Alkaline Phosphatase: 78 U/L (ref 38–126)
Anion gap: 20 — ABNORMAL HIGH (ref 5–15)
BUN: 52 mg/dL — ABNORMAL HIGH (ref 8–23)
CO2: 18 mmol/L — ABNORMAL LOW (ref 22–32)
Calcium: 10 mg/dL (ref 8.9–10.3)
Chloride: 87 mmol/L — ABNORMAL LOW (ref 98–111)
Creatinine, Ser: 1.81 mg/dL — ABNORMAL HIGH (ref 0.61–1.24)
GFR, Estimated: 38 mL/min — ABNORMAL LOW (ref >60)
Glucose, Bld: 520 mg/dL (ref 70–99)
Potassium: 4.2 mmol/L (ref 3.5–5.1)
Sodium: 125 mmol/L — ABNORMAL LOW (ref 135–145)
Total Bilirubin: 1.1 mg/dL (ref 0.0–1.2)
Total Protein: 7.4 g/dL (ref 6.5–8.1)

## 2024-04-22 LAB — TROPONIN I (HIGH SENSITIVITY): Troponin I (High Sensitivity): 27 ng/L — ABNORMAL HIGH (ref <18)

## 2024-04-22 LAB — I-STAT VENOUS BLOOD GAS, ED
Acid-Base Excess: 0 mmol/L (ref 0.0–2.0)
Bicarbonate: 20.3 mmol/L (ref 20.0–28.0)
Calcium, Ion: 1 mmol/L — ABNORMAL LOW (ref 1.15–1.40)
HCT: 47 % (ref 39.0–52.0)
Hemoglobin: 16 g/dL (ref 13.0–17.0)
O2 Saturation: 87 %
Potassium: 4.2 mmol/L (ref 3.5–5.1)
Sodium: 121 mmol/L — ABNORMAL LOW (ref 135–145)
TCO2: 21 mmol/L — ABNORMAL LOW (ref 22–32)
pCO2, Ven: 24.2 mmHg — ABNORMAL LOW (ref 44–60)
pH, Ven: 7.531 — ABNORMAL HIGH (ref 7.25–7.43)
pO2, Ven: 45 mmHg (ref 32–45)

## 2024-04-22 LAB — CBG MONITORING, ED: Glucose-Capillary: 484 mg/dL — ABNORMAL HIGH (ref 70–99)

## 2024-04-22 LAB — LACTIC ACID, PLASMA: Lactic Acid, Venous: 2.7 mmol/L (ref 0.5–1.9)

## 2024-04-22 MED ORDER — DEXTROSE 50 % IV SOLN: 0.0000 mL | INTRAVENOUS | Status: DC | PRN

## 2024-04-22 MED ORDER — INSULIN REGULAR(HUMAN) IN NACL 100-0.9 UT/100ML-% IV SOLN
INTRAVENOUS | Status: DC
Start: 2024-04-22 — End: 2024-04-23
  Administered 2024-04-22: 11.5 [IU]/h via INTRAVENOUS
  Filled 2024-04-22: qty 100

## 2024-04-22 MED ORDER — DEXTROSE IN LACTATED RINGERS 5 % IV SOLN
INTRAVENOUS | Status: DC
Start: 2024-04-22 — End: 2024-04-22

## 2024-04-22 MED ORDER — LACTATED RINGERS IV SOLN: INTRAVENOUS | Status: DC

## 2024-04-22 MED ORDER — POTASSIUM CHLORIDE 10 MEQ/100ML IV SOLN
10.0000 meq | INTRAVENOUS | Status: AC
  Administered 2024-04-23: 10 meq via INTRAVENOUS
  Filled 2024-04-22: qty 100

## 2024-04-22 MED ORDER — LACTATED RINGERS IV BOLUS
20.0000 mL/kg | Freq: Once | INTRAVENOUS | Status: AC
  Administered 2024-04-22: 1524 mL via INTRAVENOUS

## 2024-04-22 NOTE — ED Provider Notes (Signed)
 Patrick Stevens Provider Note   CSN: [id number] Arrival date & time: 04/22/24  1844     Patient presents with: No chief complaint on file.   Patrick Stevens is a 78 y.o. male.   78 year old male presents for evaluation of dizziness and frequent urination.  He was recently diagnosed with gout and started on allopurinol and prednisone .  He has a history of prediabetes and is on metformin.  He mitts to some fatigue and lightheadedness as well but denies any nausea vomiting or diarrhea.        Prior to Admission medications   Medication Sig Start Date End Date Taking? Authorizing Provider  aspirin EC 81 MG tablet Take 81 mg by mouth daily. Swallow whole.    [provider]  indomethacin (INDOCIN) 50 MG capsule Take 50 mg by mouth 2 (two) times daily as needed (gout pain).    [provider]  lovastatin (MEVACOR) 40 MG tablet Take 40 mg by mouth daily with supper.    [provider]  metFORMIN (GLUCOPHAGE) 500 MG tablet Take 500 mg by mouth daily with supper.    [provider]  valsartan-hydrochlorothiazide (DIOVAN-HCT) 160-12.5 MG tablet Take 1 tablet by mouth in the morning.    [provider]    Allergies: Patient has no known allergies.    Review of Systems  Constitutional:  Positive for fatigue. Negative for chills and fever.  HENT:  Negative for ear pain and sore throat.   Eyes:  Negative for pain and visual disturbance.  Respiratory:  Negative for cough and shortness of breath.   Cardiovascular:  Negative for chest pain and palpitations.  Gastrointestinal:  Negative for abdominal pain and vomiting.  Endocrine: Positive for polydipsia.  Genitourinary:  Negative for dysuria and hematuria.  Musculoskeletal:  Negative for arthralgias and back pain.  Skin:  Negative for color change and rash.  Neurological:  Positive for light-headedness. Negative for seizures and syncope.  All other  systems reviewed and are negative.   Updated Vital Signs BP 113/78 (BP Location: Right Arm)   Pulse (!) 101   Temp 97.6 F (36.4 C) (Oral)   Resp (!) 21   Ht 5' 4 (1.626 m)   Wt 76.2 kg   SpO2 100%   BMI 28.84 kg/m   Physical Exam Vitals and nursing note reviewed.  Constitutional:      General: He is not in acute distress.    Appearance: Normal appearance. He is well-developed. He is not ill-appearing.  HENT:     Head: Normocephalic and atraumatic.  Eyes:     Conjunctiva/sclera: Conjunctivae normal.  Cardiovascular:     Rate and Rhythm: Regular rhythm. Tachycardia present.     Heart sounds: No murmur heard. Pulmonary:     Effort: Pulmonary effort is normal. No respiratory distress.     Breath sounds: Normal breath sounds.  Abdominal:     Palpations: Abdomen is soft.     Tenderness: There is no abdominal tenderness.  Musculoskeletal:        General: No swelling.     Cervical back: Neck supple.  Skin:    General: Skin is warm and dry.     Capillary Refill: Capillary refill takes less than 2 seconds.  Neurological:     General: No focal deficit present.     Mental Status: He is alert.  Psychiatric:        Mood and Affect: Mood normal.     (  all labs ordered are listed, but only abnormal results are displayed) Labs Reviewed  CBC WITH DIFFERENTIAL/PLATELET - Abnormal; Notable for the following components:      Result Value   RBC 5.91 (*)    MCV 79.9 (*)    All other components within normal limits  COMPREHENSIVE METABOLIC PANEL WITH GFR - Abnormal; Notable for the following components:   Sodium 125 (*)    Chloride 87 (*)    CO2 18 (*)    Glucose, Bld 520 (*)    BUN 52 (*)    Creatinine, Ser 1.81 (*)    GFR, Estimated 38 (*)    Anion gap 20 (*)    All other components within normal limits  LACTIC ACID, PLASMA - Abnormal; Notable for the following components:   Lactic Acid, Venous 2.7 (*)    All other components within normal limits  I-STAT VENOUS BLOOD  GAS, ED - Abnormal; Notable for the following components:   pH, Ven 7.531 (*)    pCO2, Ven 24.2 (*)    TCO2 21 (*)    Sodium 121 (*)    Calcium, Ion 1.00 (*)    All other components within normal limits  CBG MONITORING, ED - Abnormal; Notable for the following components:   Glucose-Capillary 484 (*)    All other components within normal limits  TROPONIN I (HIGH SENSITIVITY) - Abnormal; Notable for the following components:   Troponin I (High Sensitivity) 27 (*)    All other components within normal limits  CULTURE, BLOOD (ROUTINE X 2)  CULTURE, BLOOD (ROUTINE X 2)  RESP PANEL BY RT-PCR (RSV, FLU A&B, COVID)  RVPGX2  URINALYSIS, ROUTINE W REFLEX MICROSCOPIC  LACTIC ACID, PLASMA  BETA-HYDROXYBUTYRIC ACID  BETA-HYDROXYBUTYRIC ACID  BETA-HYDROXYBUTYRIC ACID  BETA-HYDROXYBUTYRIC ACID  URINALYSIS, COMPLETE (UACMP) WITH MICROSCOPIC  LACTIC ACID, PLASMA  LACTIC ACID, PLASMA  LACTIC ACID, PLASMA  CK  MAGNESIUM  PHOSPHORUS  CREATININE, URINE, RANDOM  SODIUM, URINE, RANDOM  TROPONIN I (HIGH SENSITIVITY)  TROPONIN I (HIGH SENSITIVITY)    EKG: EKG Interpretation Date/Time:  Wednesday April 22 2024 18:56:12 EDT Ventricular Rate:  113 PR Interval:  176 QRS Duration:  90 QT Interval:  326 QTC Calculation: 447 R Axis:   67  Text Interpretation: Sinus tachycardia Left ventricular hypertrophy with repolarization abnormality ( Cornell product ) Compared with prior EKG from 01/07/2002 Confirmed by Gennaro Bouchard ([id number]) on 04/22/2024 9:51:04 PM  Radiology: [facility] Chest 2 View Result Date: 04/22/2024 EXAM: 2 VIEW(S) XRAY OF THE CHEST 04/22/2024 08:28:00 PM COMPARISON: X-ray 04/26/2021. CLINICAL HISTORY: Tachycardia, fever at home, dizziness, fatigue, frequent urination. FINDINGS: LUNGS AND PLEURA: Low lung volumes. Bibasilar Atelectasis. Otherwise No focal pulmonary opacity. No pulmonary edema. No pleural effusion. No pneumothorax. HEART AND MEDIASTINUM: No acute abnormality of the cardiac  and mediastinal silhouettes. BONES AND SOFT TISSUES: Thoracic degenerative changes. No acute osseous abnormality. IMPRESSION: 1. No acute findings. Electronically signed by: Norman Gatlin MD 04/22/2024 08:38 PM EDT RP Workstation: [id number]     Procedures   Medications Ordered in the ED  insulin  regular, human (MYXREDLIN ) 100 units/ 100 mL infusion (11.5 Units/hr Intravenous New Bag/Given 04/22/24 2329)  lactated ringers  infusion (has no administration in time range)  dextrose  50 % solution 0-50 mL (has no administration in time range)  potassium chloride  10 mEq in 100 mL IVPB (has no administration in time range)  lactated ringers  bolus 1,524 mL (1,524 mLs Intravenous New Bag/Given 04/22/24 2321)  Medical Decision Making Cardiac monitor interpretation: Sinus tachycardia, no ectopy  Patient found to have DKA as well as lactic acidosis.  He has an anion gap of 20.  Was given IV fluids and started on insulin  drip.  Tachycardia did improve.  Also found to have a slight elevation of troponin, I think this is likely demand.  He is not having any chest pain.  He has prediabetes but has been on steroids lately for gout.  Discussed patient's case with hospitalist and patient will be admitted for further workup and management.  Patient and family bedside agreeable with the plan  Problems Addressed: Diabetic ketoacidosis without coma associated with type 2 diabetes mellitus (HCC): acute illness or injury that poses a threat to life or bodily functions Elevated troponin: undiagnosed new problem with uncertain prognosis Lactic acidosis: acute illness or injury that poses a threat to life or bodily functions  Amount and/or Complexity of Data Reviewed External Data Reviewed: notes.    Details: Prior records reviewed and patient recently treated for gout with prednisone  Labs: ordered. Decision-making details documented in ED Course.    Details: Ordered and reviewed  by me and patient has DKA, Actiq acidosis elevated troponin and hyperglycemia Radiology: ordered and independent interpretation performed. Decision-making details documented in ED Course.    Details: Ordered and interpreted independently of radiology Chest x-ray: Shows no acute abnormality ECG/medicine tests: ordered and independent interpretation performed. Decision-making details documented in ED Course.    Details: Ordered and interpreted by me in the absence of cardiology shows sinus tachycardia, no STEMI or acute change when compared to prior Discussion of management or test interpretation with external provider(s): Dr. Silvester -hospitalist-I spoke with her on the phone regarding the patient's case and she will admit the patient for further workup and management  Risk OTC drugs. Prescription drug management. Drug therapy requiring intensive monitoring for toxicity. Decision regarding hospitalization. Risk Details: CRITICAL CARE Performed by: Duwaine LITTIE Fusi   Total critical care time: 35 minutes  Critical care time was exclusive of separately billable procedures and treating other patients.  Critical care was necessary to treat or prevent imminent or life-threatening deterioration.  Critical care was time spent personally by me on the following activities: development of treatment plan with patient and/or surrogate as well as nursing, discussions with consultants, evaluation of patient's response to treatment, examination of patient, obtaining history from patient or surrogate, ordering and performing treatments and interventions, ordering and review of laboratory studies, ordering and review of radiographic studies, pulse oximetry and re-evaluation of patient's condition.   Critical Care Total time providing critical care: 35 minutes    Final diagnoses:  Diabetic ketoacidosis without coma associated with type 2 diabetes mellitus (HCC)  Elevated troponin  Lactic acidosis     ED Discharge Orders     None          Fusi Duwaine LITTIE, DO 04/22/24 2339

## 2024-04-22 NOTE — Assessment & Plan Note (Signed)
 will admit per  [facility]HS protocol, obtain serial BMET, start on glucosestabalizer, aggressive IVF.   So far work up of possible causes of  HSS with CXR, ECG one set of cardiac enzymes, UA.    Most likely cause been use of steroids Monitor progressive replace potassium as needed.   Consult diabetes coordinator

## 2024-04-22 NOTE — ED Triage Notes (Signed)
 Pt ambulatory to triage with reports of fatigue and dizziness for a week. Pt reports his PCP increased his HTN meds and gout meds 2 weeks ago. Pt has had increased urination.

## 2024-04-22 NOTE — Assessment & Plan Note (Signed)
 Rehydrate and hold metformin follow lactic acid

## 2024-04-22 NOTE — Assessment & Plan Note (Signed)
 Continue to cycle and no evidence of chest pain or EKG changes Continue to trend

## 2024-04-22 NOTE — Assessment & Plan Note (Signed)
 Corrected sodium for hyperglycemia is 135 which is within normal range

## 2024-04-22 NOTE — Assessment & Plan Note (Signed)
 Possible AKI no old renal function available rehydrating follow obtain electrolytes

## 2024-04-22 NOTE — Assessment & Plan Note (Signed)
 Will order echo in AM

## 2024-04-22 NOTE — ED Provider Triage Note (Signed)
 Emergency Medicine Provider Triage Evaluation Note  Patrick Stevens , a 78 y.o. male  was evaluated in triage.  Pt complains of dizziness and fatigue.  Patient was recently started on allopurinol for gout in his left hand, reports gout symptoms have improved however he does feel that the medication is contributing to worsening dizziness.  He states when he gets up he feels lightheaded.  His wife also tells me he had a fever at home last night, they were unable to give me a specific number but believes that it was over 100.  He also endorses urinary frequency and dysuria.  Denies nausea/vomiting/diarrhea/abdominal pain.  Review of Systems  Positive: As above Negative: As above  Physical Exam  BP (!) 119/93 (BP Location: Right Arm)   Pulse (!) 118   Temp 97.7 F (36.5 C) (Oral)   Resp 16   Ht 5' 4 (1.626 m)   Wt 76.2 kg   SpO2 100%   BMI 28.84 kg/m  Gen:   Awake, no distress   Resp:  Normal effort  MSK:   Moves extremities without difficulty  Other:  Abdomen is soft and nontender to palpation.  No CVA tenderness bilaterally. No focal neurologic deficits.   Medical Decision Making  Medically screening exam initiated at 7:19 PM.  Appropriate orders placed.  Patrick Stevens was informed that the remainder of the evaluation will be completed by another provider, this initial triage assessment does not replace that evaluation, and the importance of remaining in the ED until their evaluation is complete.     Glendia Rocky SAILOR, [place] 04/22/24 1921

## 2024-04-22 NOTE — H&P (Signed)
 Patrick Stevens FMW:[id number] DOB: December 06, 1945 DOA: 04/22/2024     PCP: Ransom Other, MD   Outpatient Specialists:   CARDS:  Dr. Lonni LITTIE Nanas, MD    Patient arrived to ER on 04/22/24 at 1844 Referred by Attending Gennaro Duwaine LITTIE, DO   Patient coming from:    home Lives   With family     Chief Complaint: Polyuria polydipsia    HPI: Patrick Stevens is a 78 y.o. male with medical history significant of   DM2 hypertension gout, heart murmur    Presented with    low-grade fever polydipsia polyuria Patient presents with fatigue and dizziness for the past 1 week his primary care provider recently increased his hypertension meds and his gout meds he has been having increasing urination He attributed it to starting allopurinol and prednisone  his gout symptoms have improved  Reports low-grade fever last night about 100 Has been having increased thirst and increased urination No nausea vomiting no diarrhea no abdominal pain Patient is diabetic And emergency department found to have blood sugar 520 tachycardic up to 114 lactic acid 2.7 given IV fluids started on insulin  drip     He has never been told that he has any kidney disease Denies significant ETOH intake   Does not smoke      Regarding pertinent Chronic problems:    Hyperlipidemia -  on statins  lovastatin     HTN on lisinopril       DM 2 - PO meds only,               While in ER:   Sodium 125 bicarb 18 glucose 520 creatinine 1. 8 Lactic acid 2.7 anion gap 20 Troponin 27    Lab Orders         Blood culture (routine x 2)         CBC with Differential         Comprehensive metabolic panel         Urinalysis, Routine w reflex microscopic -Urine, Clean Catch         Lactic acid, plasma         Beta-hydroxybutyric acid         I-Stat venous blood gas, ([facility], [facility], [facility])         CBG monitoring, ED         CBG monitoring, ED       CXR -  NON acute   Following Medications were ordered in  ER: Medications  lactated ringers  bolus 1,524 mL (has no administration in time range)  insulin  regular, human (MYXREDLIN ) 100 units/ 100 mL infusion (has no administration in time range)  lactated ringers  infusion (has no administration in time range)  dextrose  5 % in lactated ringers  infusion (has no administration in time range)  dextrose  50 % solution 0-50 mL (has no administration in time range)  potassium chloride  10 mEq in 100 mL IVPB (has no administration in time range)    ________________     ED Triage Vitals  Encounter Vitals Group     BP 04/22/24 1848 (!) 119/93     Girls Systolic BP Percentile --      Girls Diastolic BP Percentile --      Boys Systolic BP Percentile --      Boys Diastolic BP Percentile --      Pulse Rate 04/22/24 1848 (!) 118     Resp 04/22/24 1848 16  Temp 04/22/24 1848 97.7 F (36.5 C)     Temp Source 04/22/24 1848 Oral     SpO2 04/22/24 1848 100 %     Weight 04/22/24 1850 168 lb (76.2 kg)     Height 04/22/24 1850 5' 4 (1.626 m)     Head Circumference --      Peak Flow --      Pain Score 04/22/24 1850 0     Pain Loc --      Pain Education --      Exclude from Growth Chart --   UFJK(75)@     _________________________________________ Significant initial  Findings: Abnormal Labs Reviewed  CBC WITH DIFFERENTIAL/PLATELET - Abnormal; Notable for the following components:      Result Value   RBC 5.91 (*)    MCV 79.9 (*)    All other components within normal limits  COMPREHENSIVE METABOLIC PANEL WITH GFR - Abnormal; Notable for the following components:   Sodium 125 (*)    Chloride 87 (*)    CO2 18 (*)    Glucose, Bld 520 (*)    BUN 52 (*)    Creatinine, Ser 1.81 (*)    GFR, Estimated 38 (*)    Anion gap 20 (*)    All other components within normal limits  LACTIC ACID, PLASMA - Abnormal; Notable for the following components:   Lactic Acid, Venous 2.7 (*)    All other components within normal limits  TROPONIN I (HIGH SENSITIVITY) -  Abnormal; Notable for the following components:   Troponin I (High Sensitivity) 27 (*)    All other components within normal limits      _________________________ Troponin  ordered Cardiac Panel (last 3 results) Recent Labs    04/22/24 1936  TROPONINIHS 27*     ECG: Ordered Personally reviewed and interpreted by me showing: HR : 113 Rhythm: Sinus tachycardia Left ventricular hypertrophy with repolarization abnormality QTC 447  The recent clinical data is shown below. Vitals:   04/22/24 1848 04/22/24 1850 04/22/24 2205  BP: (!) 119/93  113/78  Pulse: (!) 118  (!) 101  Resp: 16  (!) 21  Temp: 97.7 F (36.5 C)  97.6 F (36.4 C)  TempSrc: Oral  Oral  SpO2: 100%  100%  Weight:  76.2 kg   Height:  5' 4 (1.626 m)     WBC     Component Value Date/Time   WBC 8.8 04/22/2024 1936   LYMPHSABS 2.2 04/22/2024 1936   MONOABS 0.6 04/22/2024 1936   EOSABS 0.1 04/22/2024 1936   BASOSABS 0.0 04/22/2024 1936     Lactic Acid, Venous    Component Value Date/Time   LATICACIDVEN 2.7 (HH) 04/22/2024 1936      UA  ordered      _ Venous  Blood Gas result:  pH  7.531 High  Sodium 121 Low  mmol/L   pCO2, Ven 24.2 Low  mmHg Potassium 4.2 mmol/L  pO2, Ven 45 mmHg       __________________________________________________________ Recent Labs  Lab 04/22/24 1936 04/22/24 2222  NA 125* 121*  K 4.2 4.2  CO2 18*  --   GLUCOSE 520*  --   BUN 52*  --   CREATININE 1.81*  --   CALCIUM 10.0  --      Recent Labs  Lab 04/22/24 1936  AST 16  ALT 24  ALKPHOS 78  BILITOT 1.1  PROT 7.4  ALBUMIN 3.6   Lab Results  Component Value Date  CALCIUM 10.0 04/22/2024       Plt: Lab Results  Component Value Date   PLT 225 04/22/2024       Recent Labs  Lab 04/22/24 1936  WBC 8.8  NEUTROABS 5.9  HGB 16.0  HCT 47.2  MCV 79.9*  PLT 225    HG/HCT  stable,       Component Value Date/Time   HGB 16.0 04/22/2024 1936   HCT 47.2 04/22/2024 1936   MCV 79.9 (L) 04/22/2024  1936      _______________________________________________ Hospitalist was called for admission for  hyperglycemia   The following Work up has been ordered so far:  Orders Placed This Encounter  Procedures   Blood culture (routine x 2)   DG Chest 2 View   CBC with Differential   Comprehensive metabolic panel   Urinalysis, Routine w reflex microscopic -Urine, Clean Catch   Lactic acid, plasma   Beta-hydroxybutyric acid   Diet NPO time specified   ED Cardiac monitoring   ED Cardiac monitoring   Initiate Carrier Fluid Protocol   Notify physician (specify)   If present, discontinue Insulin  Pump after IV Insulin  is initiated.   Do NOT use lab glucose values in [facility].  If CBG meter reads Critical High, enter 600.   IV insulin  infusion with sufficient glucose should be continued until MD determines acidosis is corrected and places transition orders.   Upon IV fluid bolus completion, place order for STAT BMET (LAB15) and call provider with results.   Consult to hospitalist   ED Pulse oximetry, continuous   I-Stat venous blood gas, ([facility], [facility], [facility])   CBG monitoring, ED   CBG monitoring, ED   EKG 12-Lead   ED EKG   Insert peripheral IV     OTHER Significant initial  Findings:  labs showing:     DM  labs:  HbA1C: No results for input(s): HGBA1C in the last 8760 hours.     CBG (last 3)  Recent Labs    04/22/24 2224  GLUCAP 484*      Radiological Exams on Admission: DG Chest 2 View Result Date: 04/22/2024 EXAM: 2 VIEW(S) XRAY OF THE CHEST 04/22/2024 08:28:00 PM COMPARISON: X-ray 04/26/2021. CLINICAL HISTORY: Tachycardia, fever at home, dizziness, fatigue, frequent urination. FINDINGS: LUNGS AND PLEURA: Low lung volumes. Bibasilar Atelectasis. Otherwise No focal pulmonary opacity. No pulmonary edema. No pleural effusion. No pneumothorax. HEART AND MEDIASTINUM: No acute abnormality of the cardiac and mediastinal silhouettes. BONES AND SOFT TISSUES: Thoracic  degenerative changes. No acute osseous abnormality. IMPRESSION: 1. No acute findings. Electronically signed by: Norman Gatlin MD 04/22/2024 08:38 PM EDT RP Workstation: [id number]   _______________________________________________________________________________________________________ Latest  Blood pressure 113/78, pulse (!) 101, temperature 97.6 F (36.4 C), temperature source Oral, resp. rate (!) 21, height 5' 4 (1.626 m), weight 76.2 kg, SpO2 100%.   Vitals  labs and radiology finding personally reviewed  Review of Systems:    Pertinent positives include:  Fevers,dysuria, polyuria  Constitutional:  No weight loss, night sweats,  chills, fatigue, weight loss  HEENT:  No headaches, Difficulty swallowing,Tooth/dental problems,Sore throat,  No sneezing, itching, ear ache, nasal congestion, post nasal drip,  Cardio-vascular:  No chest pain, Orthopnea, PND, anasarca, dizziness, palpitations.no Bilateral lower extremity swelling  GI:  No heartburn, indigestion, abdominal pain, nausea, vomiting, diarrhea, change in bowel habits, loss of appetite, melena, blood in stool, hematemesis Resp:  no shortness of breath at rest. No dyspnea on exertion, No excess mucus, no productive cough, No non-productive  cough, No coughing up of blood.No change in color of mucus.No wheezing. Skin:  no rash or lesions. No jaundice GU:  no , change in color of urine, no urgency or frequency. No straining to urinate.  No flank pain.  Musculoskeletal:  No joint pain or no joint swelling. No decreased range of motion. No back pain.  Psych:  No change in mood or affect. No depression or anxiety. No memory loss.  Neuro: no localizing neurological complaints, no tingling, no weakness, no double vision, no gait abnormality, no slurred speech, no confusion  All systems reviewed and apart from HOPI all are negative _______________________________________________________________________________________________ Past  Medical History:   Past Medical History:  Diagnosis Date   Hypertension       No past surgical history on file.  Social History:  Ambulatory   independently       reports that he has never smoked. He does not have any smokeless tobacco history on file. He reports current alcohol use. No history on file for drug use.     Family History:   Non contributory ______________________________________________________________________________________________ Allergies: No Known Allergies   Prior to Admission medications   Medication Sig Start Date End Date Taking? Authorizing Provider  aspirin EC 81 MG tablet Take 81 mg by mouth daily. Swallow whole.    [provider]  indomethacin (INDOCIN) 50 MG capsule Take 50 mg by mouth 2 (two) times daily as needed (gout pain).    [provider]  lovastatin (MEVACOR) 40 MG tablet Take 40 mg by mouth daily with supper.    [provider]  metFORMIN (GLUCOPHAGE) 500 MG tablet Take 500 mg by mouth daily with supper.    [provider]  valsartan-hydrochlorothiazide (DIOVAN-HCT) 160-12.5 MG tablet Take 1 tablet by mouth in the morning.    [provider]    ___________________________________________________________________________________________________ Physical Exam:    04/22/2024   10:05 PM 04/22/2024    6:50 PM 04/22/2024    6:48 PM  Vitals with BMI  Height  5' 4   Weight  168 lbs   BMI  28.82   Systolic 113  119  Diastolic 78  93  Pulse 101  118     1. General:  in No  Acute distress   well   -appearing 2. Psychological: Alert and   Oriented 3. Head/ENT:    Dry Mucous Membranes                          Head Non traumatic, neck supple                            Poor Dentition 4. SKIN:  decreased Skin turgor,  Skin clean Dry and intact no rash    5. Heart: Regular rate and rhythm  heart  Murmur, no Rub or gallop 6. Lungs:   no wheezes or crackles   7. Abdomen: Soft,  non-tender,  Non distended   obese  bowel sounds present 8. Lower extremities: no clubbing, cyanosis, no  edema 9. Neurologically Grossly intact, moving all 4 extremities equally  10. MSK: Normal range of motion    Chart has been reviewed  ______________________________________________________________________________________________  Assessment/Plan 78 y.o. male with medical history significant of   DM2 hypertension gout, heart murmur  Admitted for  hyperglycemia   Present on Admission:  Hyperglycemia  Lactic acidosis  Hyponatremia  Elevated troponin  AKI (acute kidney injury) (  HCC)  Cardiac murmur    Hyperglycemia will admit per  [facility]HS protocol, obtain serial BMET, start on glucosestabalizer, aggressive IVF.   So far work up of possible causes of  HSS with CXR, ECG one set of cardiac enzymes, UA.    Most likely cause been use of steroids Monitor progressive replace potassium as needed.   Consult diabetes coordinator    Lactic acidosis Rehydrate and hold metformin follow lactic acid  Hyponatremia Corrected sodium for hyperglycemia is 135 which is within normal range  Elevated troponin Continue to cycle and no evidence of chest pain or EKG changes Continue to trend  AKI (acute kidney injury) (HCC) Possible AKI no old renal function available rehydrating follow obtain electrolytes  Cardiac murmur Will order echo in AM    Other plan as per orders.  DVT prophylaxis:  SCD      Code Status:    Code Status: Not on file FULL CODE  as per patient   I had personally discussed CODE STATUS with patient and family  ACP   none   Family Communication:   Family at  Bedside  plan of care was discussed   with  Wife,   Diet  Diet Orders (From admission, onward)     Start     Ordered   04/22/24 2208  Diet NPO time specified  Diet effective now        04/22/24 2208            Disposition Plan:      To home once workup is complete and patient is stable   Following barriers for  discharge:                              Electrolytes corrected                                                            Will need to be able to tolerate PO                              Consult Orders  (From admission, onward)           Start     Ordered   04/22/24 2210  Consult to hospitalist  Pg by Elspeth  Once       Provider:  (Not yet assigned)  Question Answer Comment  Place call to: [facility]   Reason for Consult Admit      04/22/24 2209                               Diabetes care coordinator                                       Consults called:    NONE   Admission status:  ED Disposition     ED Disposition  Admit   Condition  --   Comment  Hospital Area: [facility] [[id number]]  Level of Care: Progressive [102]  Admit to Progressive based on following criteria: GI,  ENDOCRINE disease patients with GI bleeding, acute liver failure or pancreatitis, stable with diabetic ketoacidosis or thyrotoxicosis (hypothyroid) state.  May place patient in observation at [facility] or [facility] if equivalent level of care is available:: No  Covid Evaluation: Asymptomatic - no recent exposure (last 10 days) testing not required  Diagnosis: Hyperglycemia [707513]  Admitting Physician: Jahi Roza [[id number]]  Attending Physician: Maryjayne Kleven [[id number]]  For patients discharging to extended facilities (i.e. SNF, AL, group homes or LTAC) initiate:: Discharge to SNF/Facility Placement COVID-19 Lab Testing Protocol           Obs      Level of care       progressive        Davielle Lingelbach 04/22/2024, 11:00 PM    [facility]     after 2 AM please page floor coverage   If 7AM-7PM, please contact the day team taking care of the patient using [email or web address]

## 2024-04-22 NOTE — Subjective & Objective (Signed)
 Patient presents with fatigue and dizziness for the past 1 week his primary care provider recently increased his hypertension meds and his gout meds he has been having increasing urination He attributed it to starting allopurinol and prednisone  his gout symptoms have improved  Reports low-grade fever last night about 100 Has been having increased thirst and increased urination No nausea vomiting no diarrhea no abdominal pain Patient is diabetic And emergency department found to have blood sugar 520 tachycardic up to 114 lactic acid 2.7 given IV fluids started on insulin  drip

## 2024-04-23 ENCOUNTER — Other Ambulatory Visit (HOSPITAL_COMMUNITY): Payer: Self-pay

## 2024-04-23 ENCOUNTER — Encounter (HOSPITAL_COMMUNITY): Payer: Self-pay | Admitting: Internal Medicine

## 2024-04-23 ENCOUNTER — Telehealth (HOSPITAL_COMMUNITY): Payer: Self-pay | Admitting: Pharmacy Technician

## 2024-04-23 ENCOUNTER — Observation Stay (HOSPITAL_BASED_OUTPATIENT_CLINIC_OR_DEPARTMENT_OTHER)

## 2024-04-23 DIAGNOSIS — N1831 Chronic kidney disease, stage 3a: Secondary | ICD-10-CM | POA: Diagnosis not present

## 2024-04-23 DIAGNOSIS — E1100 Type 2 diabetes mellitus with hyperosmolarity without nonketotic hyperglycemic-hyperosmolar coma (NKHHC): Secondary | ICD-10-CM

## 2024-04-23 DIAGNOSIS — R7989 Other specified abnormal findings of blood chemistry: Secondary | ICD-10-CM

## 2024-04-23 DIAGNOSIS — E871 Hypo-osmolality and hyponatremia: Secondary | ICD-10-CM | POA: Diagnosis not present

## 2024-04-23 DIAGNOSIS — R011 Cardiac murmur, unspecified: Secondary | ICD-10-CM | POA: Diagnosis not present

## 2024-04-23 DIAGNOSIS — E1121 Type 2 diabetes mellitus with diabetic nephropathy: Secondary | ICD-10-CM | POA: Insufficient documentation

## 2024-04-23 DIAGNOSIS — I10 Essential (primary) hypertension: Secondary | ICD-10-CM | POA: Diagnosis not present

## 2024-04-23 DIAGNOSIS — N179 Acute kidney failure, unspecified: Secondary | ICD-10-CM | POA: Diagnosis not present

## 2024-04-23 LAB — BASIC METABOLIC PANEL WITH GFR
Anion gap: 18 — ABNORMAL HIGH (ref 5–15)
Anion gap: 15 (ref 5–15)
BUN: 48 mg/dL — ABNORMAL HIGH (ref 8–23)
BUN: 42 mg/dL — ABNORMAL HIGH (ref 8–23)
CO2: 20 mmol/L — ABNORMAL LOW (ref 22–32)
CO2: 22 mmol/L (ref 22–32)
Calcium: 10 mg/dL (ref 8.9–10.3)
Calcium: 9.3 mg/dL (ref 8.9–10.3)
Chloride: 93 mmol/L — ABNORMAL LOW (ref 98–111)
Chloride: 96 mmol/L — ABNORMAL LOW (ref 98–111)
Creatinine, Ser: 1.87 mg/dL — ABNORMAL HIGH (ref 0.61–1.24)
Creatinine, Ser: 1.37 mg/dL — ABNORMAL HIGH (ref 0.61–1.24)
GFR, Estimated: 36 mL/min — ABNORMAL LOW (ref >60)
GFR, Estimated: 53 mL/min — ABNORMAL LOW (ref >60)
Glucose, Bld: 286 mg/dL — ABNORMAL HIGH (ref 70–99)
Glucose, Bld: 291 mg/dL — ABNORMAL HIGH (ref 70–99)
Potassium: 3.6 mmol/L (ref 3.5–5.1)
Potassium: 3.6 mmol/L (ref 3.5–5.1)
Sodium: 131 mmol/L — ABNORMAL LOW (ref 135–145)
Sodium: 133 mmol/L — ABNORMAL LOW (ref 135–145)

## 2024-04-23 LAB — ECHOCARDIOGRAM COMPLETE
AR max vel: 1.01 cm2
AV Area VTI: 1.05 cm2
AV Area mean vel: 1.05 cm2
AV Mean grad: 28.3 mmHg
AV Peak grad: 48.2 mmHg
Ao pk vel: 3.47 m/s
Height: 64 in
S' Lateral: 2.1 cm
Weight: 2688 [oz_av]

## 2024-04-23 LAB — RESP PANEL BY RT-PCR (RSV, FLU A&B, COVID)  RVPGX2
Influenza A by PCR: NEGATIVE
Influenza B by PCR: NEGATIVE
Resp Syncytial Virus by PCR: NEGATIVE
SARS Coronavirus 2 by RT PCR: NEGATIVE

## 2024-04-23 LAB — URINALYSIS, COMPLETE (UACMP) WITH MICROSCOPIC
Bacteria, UA: NONE SEEN
Bilirubin Urine: NEGATIVE
Glucose, UA: >=500 mg/dL — AB
Ketones, ur: 5 mg/dL — AB
Leukocytes,Ua: NEGATIVE
Nitrite: NEGATIVE
Protein, ur: NEGATIVE mg/dL
Specific Gravity, Urine: 1.005 (ref 1.005–1.030)
pH: 5 (ref 5.0–8.0)

## 2024-04-23 LAB — OSMOLALITY: Osmolality: 304 mosm/kg — ABNORMAL HIGH (ref 275–295)

## 2024-04-23 LAB — LACTIC ACID, PLASMA
Lactic Acid, Venous: 5.2 mmol/L (ref 0.5–1.9)
Lactic Acid, Venous: 2.3 mmol/L (ref 0.5–1.9)

## 2024-04-23 LAB — CBG MONITORING, ED
Glucose-Capillary: 127 mg/dL — ABNORMAL HIGH (ref 70–99)
Glucose-Capillary: 150 mg/dL — ABNORMAL HIGH (ref 70–99)
Glucose-Capillary: 155 mg/dL — ABNORMAL HIGH (ref 70–99)
Glucose-Capillary: 160 mg/dL — ABNORMAL HIGH (ref 70–99)
Glucose-Capillary: 195 mg/dL — ABNORMAL HIGH (ref 70–99)
Glucose-Capillary: 274 mg/dL — ABNORMAL HIGH (ref 70–99)
Glucose-Capillary: 328 mg/dL — ABNORMAL HIGH (ref 70–99)
Glucose-Capillary: 333 mg/dL — ABNORMAL HIGH (ref 70–99)

## 2024-04-23 LAB — TROPONIN I (HIGH SENSITIVITY)
Troponin I (High Sensitivity): 30 ng/L — ABNORMAL HIGH (ref <18)
Troponin I (High Sensitivity): 39 ng/L — ABNORMAL HIGH (ref <18)

## 2024-04-23 LAB — HEMOGLOBIN A1C
Hgb A1c MFr Bld: 12 % — ABNORMAL HIGH (ref 4.8–5.6)
Mean Plasma Glucose: 297.7 mg/dL

## 2024-04-23 LAB — MAGNESIUM
Magnesium: 2.1 mg/dL (ref 1.7–2.4)
Magnesium: 1.8 mg/dL (ref 1.7–2.4)

## 2024-04-23 LAB — PHOSPHORUS
Phosphorus: 1.7 mg/dL — ABNORMAL LOW (ref 2.5–4.6)
Phosphorus: 3 mg/dL (ref 2.5–4.6)

## 2024-04-23 LAB — BETA-HYDROXYBUTYRIC ACID
Beta-Hydroxybutyric Acid: 0.97 mmol/L — ABNORMAL HIGH (ref 0.05–0.27)
Beta-Hydroxybutyric Acid: 0.26 mmol/L (ref 0.05–0.27)

## 2024-04-23 LAB — CK: Total CK: 85 U/L (ref 49–397)

## 2024-04-23 LAB — CREATININE, URINE, RANDOM: Creatinine, Urine: 26 mg/dL

## 2024-04-23 LAB — SODIUM, URINE, RANDOM: Sodium, Ur: 56 mmol/L

## 2024-04-23 MED ORDER — PRAVASTATIN SODIUM 40 MG PO TABS: 40.0000 mg | ORAL_TABLET | Freq: Every day | ORAL | Status: DC

## 2024-04-23 MED ORDER — INSULIN GLARGINE 100 UNIT/ML ~~LOC~~ SOLN
10.0000 [IU] | SUBCUTANEOUS | Status: DC
  Administered 2024-04-23: 10 [IU] via SUBCUTANEOUS
  Filled 2024-04-23: qty 0.1

## 2024-04-23 MED ORDER — INSULIN ASPART 100 UNIT/ML IJ SOLN: 0.0000 [IU] | Freq: Every day | INTRAMUSCULAR | Status: DC

## 2024-04-23 MED ORDER — PEN NEEDLES 31G X 5 MM MISC: 1.0000 | Freq: Three times a day (TID) | 0 refills | Status: AC

## 2024-04-23 MED ORDER — BLOOD GLUCOSE MONITORING SUPPL DEVI: 1.0000 | Freq: Three times a day (TID) | 0 refills | Status: AC

## 2024-04-23 MED ORDER — INSULIN ASPART 100 UNIT/ML IJ SOLN: 0.0000 [IU] | Freq: Three times a day (TID) | INTRAMUSCULAR | Status: DC

## 2024-04-23 MED ORDER — PERFLUTREN LIPID MICROSPHERE
1.0000 mL | INTRAVENOUS | Status: AC | PRN
  Administered 2024-04-23: 5 mL via INTRAVENOUS

## 2024-04-23 MED ORDER — DEXTROSE IN LACTATED RINGERS 5 % IV SOLN: INTRAVENOUS | Status: DC

## 2024-04-23 MED ORDER — INSULIN ASPART 100 UNIT/ML IJ SOLN
0.0000 [IU] | Freq: Three times a day (TID) | INTRAMUSCULAR | Status: DC
  Administered 2024-04-23: 3 [IU] via SUBCUTANEOUS
  Administered 2024-04-23: 1 [IU] via SUBCUTANEOUS

## 2024-04-23 MED ORDER — INSULIN REGULAR(HUMAN) IN NACL 100-0.9 UT/100ML-% IV SOLN: INTRAVENOUS | Status: DC

## 2024-04-23 MED ORDER — INSULIN LISPRO (1 UNIT DIAL) 100 UNIT/ML (KWIKPEN): 0.0000 [IU] | PEN_INJECTOR | Freq: Three times a day (TID) | SUBCUTANEOUS | 0 refills | Status: AC

## 2024-04-23 MED ORDER — INSULIN GLARGINE 100 UNIT/ML SOLOSTAR PEN: 12.0000 [IU] | PEN_INJECTOR | Freq: Every day | SUBCUTANEOUS | 0 refills | Status: AC

## 2024-04-23 MED ORDER — SODIUM CHLORIDE 0.9 % IV SOLN: INTRAVENOUS | Status: DC

## 2024-04-23 MED ORDER — LANCETS MISC: 1.0000 | Freq: Three times a day (TID) | 0 refills | Status: AC

## 2024-04-23 MED ORDER — INSULIN GLARGINE 100 UNITS/ML SOLOSTAR PEN
10.0000 [IU] | PEN_INJECTOR | SUBCUTANEOUS | Status: DC
  Filled 2024-04-23: qty 3

## 2024-04-23 MED ORDER — LANCET DEVICE MISC: 1.0000 | Freq: Three times a day (TID) | 0 refills | Status: AC

## 2024-04-23 MED ORDER — BLOOD GLUCOSE TEST VI STRP: 1.0000 | ORAL_STRIP | Freq: Three times a day (TID) | 0 refills | Status: AC

## 2024-04-23 NOTE — ED Notes (Signed)
 Pt and all belongings transported to room 3, report given to Topher RN. Oncoming RN aware needs to admin sliding scale insulin . BG 155. Pt VSS, no distress at this time

## 2024-04-23 NOTE — Telephone Encounter (Signed)
 Pharmacy Patient Advocate Encounter   Received notification from Inpatient Request that prior authorization for FreeStyle Libre 3 Plus Sensor  is required/requested.   Insurance verification completed.   The patient is insured through [facility] .   Per test claim: PA required; PA submitted to above mentioned insurance via Latent Key/confirmation #/EOC [id number] Status is pending

## 2024-04-23 NOTE — Inpatient Diabetes Management (Signed)
 Inpatient Diabetes Program Recommendations  AACE/ADA: New Consensus Statement on Inpatient Glycemic Control (2015)  Target Ranges:  Prepandial:   less than 140 mg/dL      Peak postprandial:   less than 180 mg/dL (1-2 hours)      Critically ill patients:  140 - 180 mg/dL   Lab Results  Component Value Date   GLUCAP 155 (H) 04/23/2024   HGBA1C 12.0 (H) 04/23/2024    Review of Glycemic Control  Latest Reference Range & Units 04/23/24 00:49 04/23/24 01:53 04/23/24 02:59 04/23/24 04:09 04/23/24 05:21 04/23/24 07:54 04/23/24 08:11  Glucose-Capillary 70 - 99 mg/dL 666 (H) 804 (H) 849 (H) 328 (H) 127 (H) 160 (H) 155 (H)   Diabetes history: DM 2 Outpatient Diabetes medications: Metformin 500 mg qevening Current orders for Inpatient glycemic control:  Novolog  0-6 units tid + hs Lantus  10 units Q24 hours  A1c 12% on 9/11  Inpatient Diabetes Program Recommendations:    Note: Pt was placed on prednisone  10 mg x12 days on 8/26 for gout flare that has probably contributed to pt hyperglycemia. Unsure of how long the hyperglycemia effects will last and how far the glucose trends will come back down. Pt will need close monitoring of glucose trends.   Will see pt for DM and insulin  instruction Will work with pharmacy on benefits check with basal insulins and CGMs  Thanks,  Clotilda Bull RN, MSN, BC-ADM Inpatient Diabetes Coordinator Team Pager [phone] (8a-5p)

## 2024-04-23 NOTE — Telephone Encounter (Signed)
 Pharmacy Patient Advocate Encounter  Insurance verification completed.    The patient is insured through [facility]. Patient has Medicare and is not eligible for a copay card, but may be able to apply for patient assistance or Medicare RX Payment Plan (Patient Must reach out to their plan, if eligible for payment plan), if available.    Ran test claim for Insulin  lispro 100u/ml kwikpen and the current 30 day co-pay is $35.00.   This test claim was processed through [facility]- copay amounts may vary at other pharmacies due to pharmacy/plan contracts, or as the patient moves through the different stages of their insurance plan.

## 2024-04-23 NOTE — Assessment & Plan Note (Signed)
 04-23-2024 at home he is on lisinopril/hydrochlorothiazide, norvasc 5 mg. Given AKI on CKD stage 3a, will hold lisinopril/hydrochlorothiazide at discharge. F/u with PCP next week for repeat BMP prior to restarting ACEI/hydrochlorothiazide.

## 2024-04-23 NOTE — Telephone Encounter (Signed)
 Pharmacy Patient Advocate Encounter  Received notification from [employer] that Prior Authorization for FreeStyle Libre 3 Plus Sensor  has been DENIED.  Full denial letter will be uploaded to the media tab. See denial reason below. Continuous glucose monitor system (receiver, transmitter, and sensor) is denied for not meeting the prior authorization requirement(s). Product authorization requires the following: (1) Submission of medical records (for example: chart notes, laboratory values) or claims history documenting one of the following: (A) You are being treated with insulin . (B) You have a history of problematic hypoglycemia with documentation of recurrent level 2 hypoglycemic events [glucose less than 54mg /dl (3.0 mmol/L)] that persist despite multiple attempts to adjust medication(s) and/or modify the diabetes treatment plan. (C) You have a history of problematic hypoglycemia with documentation of a history of a level 3 hypoglycemic event [glucose less than 54mg /dl (3.0 mmol/L)] characterized by altered mental and/or physical state requiring third-party assistance for treatment of hypoglycemia. (2) The treating provider has concluded that you or your caregiver has sufficient training using the continuous glucose monitor (CGM).  PA #/Case ID/Reference #: [id number]

## 2024-04-23 NOTE — Subjective & Objective (Signed)
 Pt seen and examined. Does not check his CBG at home. Only takes 500 mg metformin at home. Was told that he only has prediabetes. Pt successfully transitioned to SQ insulin . DM RN recommending lantus  and SSI. Pt's A1C of 12% with mean glucose of 297. Poorly controlled at home. Exacerbated by recent prednisone  for his gout. Gout is much improved.

## 2024-04-23 NOTE — Inpatient Diabetes Management (Signed)
 Inpatient Diabetes Program Recommendations  AACE/ADA: New Consensus Statement on Inpatient Glycemic Control (2015)  Target Ranges:  Prepandial:   less than 140 mg/dL      Peak postprandial:   less than 180 mg/dL (1-2 hours)      Critically ill patients:  140 - 180 mg/dL   Lab Results  Component Value Date   GLUCAP 274 (H) 04/23/2024   HGBA1C 12.0 (H) 04/23/2024    Discussed CGM with pt and placed FSL3+ sensor on left arm. Assisted pt in download of FSL3 app. Prior authorization denied, however this was ran without the insulin  taken into account. Prior authorization will need to be ran again with the inclusion of insulin . Discussed different types of insulin  and walked pt through sliding scale. Took video with pts cell phone on operation of insulin  pen. Pt reports wife has diabetes also and takes injections.  Educated patient on insulin  pen use at home. Reviewed contents of insulin  flexpen starter kit. Reviewed all steps of insulin  pen including attachment of needle, 2-unit air shot, dialing up dose, giving injection, removing needle, disposal of sharps, storage of unused insulin , disposal of insulin  etc. Patient able to provide successful return demonstration. Also reviewed troubleshooting with insulin  pen. MD to give patient Rxs for insulin  pens and insulin  pen needles.   Thanks,  Clotilda Bull RN, MSN, BC-ADM Inpatient Diabetes Coordinator Team Pager [phone] (8a-5p)

## 2024-04-23 NOTE — Assessment & Plan Note (Signed)
 04-23-2024 no CP. Troponins are flat. Echo pending. Due to lactic acidosis and AKI on CKD from [facility]. Not demand ischemia.

## 2024-04-23 NOTE — ED Notes (Signed)
 Diabetes coordinator at bedside for pt education.

## 2024-04-23 NOTE — Assessment & Plan Note (Addendum)
 04-23-2024 echo performed. Awaiting final result. He can f/u with PCP office for results.

## 2024-04-23 NOTE — Progress Notes (Signed)
 PROGRESS NOTE    Patrick Stevens  FMW:[id number] DOB: 1945-08-26 DOA: 04/22/2024 PCP: Ransom Other, MD  Subjective: Pt seen and examined. Does not check his CBG at home. Only takes 500 mg metformin at home. Was told that he only has prediabetes. Pt successfully transitioned to SQ insulin . DM RN recommending lantus  and SSI. Pt's A1C of 12% with mean glucose of 297. Poorly controlled at home. Exacerbated by recent prednisone  for his gout. Gout is much improved.   Hospital Course: CC: Polyuria polydipsia  HPI: Patrick Stevens is a 78 y.o. male with medical history significant of   DM2 hypertension gout, heart murmur   He presented with low-grade, fever, polydipsia, polyuria.  Patient presents with fatigue and dizziness for the past 1 week his primary care provider recently increased his hypertension meds and his gout meds he has been having increasing urination. He attributed it to starting allopurinol and prednisone . his gout symptoms have improved. Reports low-grade fever last night about 100. He Has been having increased thirst and increased urination. No nausea vomiting no diarrhea no abdominal pain. Patient is diabetic. And emergency department found to have blood sugar 520, tachycardic up to 114, lactic acid 2.7. He was given IV fluids started on insulin  drip. He has never been told that he has any kidney disease  Significant Events: Admitted 04/22/2024 for hyperosmolar hyperglycemia state.   Admission Labs: WBC 8.8, HgB 16, plt 225 Na 125, K 4.2, CO2 of 18, BUN 51, Scr 1.81, glu 520 Lactic acid 2.7 VBG pH 7.531, PCO2 of 45 UA glu >500, negative nitrite, negative LE Beta hydroxybutyric acid 0.97 Covid/rsv/flu negative  Admission Imaging Studies: CXR No acute findings.   Significant Labs:   Significant Imaging Studies:   Antibiotic Therapy: Anti-infectives (From admission, onward)    None       Procedures:   Consultants:     Assessment and Plan: * Type 2  diabetes mellitus with hyperosmolar hyperglycemic state (HHS) (HCC) 04-23-2024 transitioned to SQ insulin  last night. On lantus  10 units and SSI. DM RN recommends 12 units lantus  and SSI at discharge. I agree. Pharmacy will check on cost of lantus  and short acting insulin (humalog  vs novolog ) pens. DM RN to come back and instruct pt on how to give himself shots. Will discharge pt with home glucose meter. Pt may be a candidate for CGM(continuous glucose monitor). Pt to discuss this with PCP. A1c of 12%. Mean glucose 297  Acute kidney injury superimposed on stage 3a chronic kidney disease (HCC) 04-23-2024 PCP notes document CKD stage 3a. Last Scr from office 12-2023 with scr 1.16. started on IVF overnight. Scr down to 1.37. will hold lisinopril/hydrochlorothiazide at discharge. F/u with PCP next week for repeat BMP prior to restarting ACEI/hydrochlorothiazide.  Elevated troponin 04-23-2024 no CP. Troponins are flat. Echo pending. Due to lactic acidosis and AKI on CKD from HHS. Not demand ischemia.  Hyponatremia 04-23-2024 due to hyperglycemia. Admission Na corrects to 135 taking into account his hyperglycemia(Hiller, 1999)  Lactic acidosis 04-23-2024 admitted with lactic acid of 2.7. given IVF.  Due to HHS.  Essential hypertension 04-23-2024 at home he is on lisinopril/hydrochlorothiazide, norvasc 5 mg. Given AKI on CKD stage 3a, will hold lisinopril/hydrochlorothiazide at discharge. F/u with PCP next week for repeat BMP prior to restarting ACEI/hydrochlorothiazide.   Cardiac murmur 04-23-2024 echo performed. Awaiting final result. He can f/u with PCP office for results.  DVT prophylaxis: SCDs Start: 04/23/24 0205    Code Status: Full Code Family Communication: no family  at bedside. Pt is decisional. Disposition Plan: return home Reason for continuing need for hospitalization: stable for DC. Awaiting DM RN to instruct him on how to given himself insulin  pen injections. Then he can be  discharged.  Objective: Vitals:   04/23/24 0800 04/23/24 0845 04/23/24 1015 04/23/24 1045  BP: (!) 125/107 116/64 124/72   Pulse: (!) 109 (!) 106 (!) 103 97  Resp: (!) 22 16 16 13   Temp:      TempSrc:      SpO2: 100% 98% 98% 94%  Weight:      Height:       No intake or output data in the 24 hours ending 04/23/24 1140 Filed Weights   04/22/24 1850  Weight: 76.2 kg    Examination:  Physical Exam Vitals and nursing note reviewed.  Constitutional:      General: He is not in acute distress.    Appearance: He is normal weight. He is not toxic-appearing or diaphoretic.  HENT:     Head: Normocephalic and atraumatic.     Nose: Nose normal.  Eyes:     General: No scleral icterus. Cardiovascular:     Rate and Rhythm: Normal rate and regular rhythm.  Pulmonary:     Effort: Pulmonary effort is normal.     Breath sounds: Normal breath sounds.  Abdominal:     General: Bowel sounds are normal. There is no distension.     Palpations: Abdomen is soft.     Tenderness: There is no abdominal tenderness.  Musculoskeletal:     Right lower leg: No edema.     Left lower leg: No edema.  Skin:    General: Skin is warm and dry.     Capillary Refill: Capillary refill takes less than 2 seconds.  Neurological:     General: No focal deficit present.     Mental Status: He is alert and oriented to person, place, and time.     Data Reviewed: I have personally reviewed following labs and imaging studies  CBC: Recent Labs  Lab 04/22/24 1936 04/22/24 2222  WBC 8.8  --   NEUTROABS 5.9  --   HGB 16.0 16.0  HCT 47.2 47.0  MCV 79.9*  --   PLT 225  --    Basic Metabolic Panel: Recent Labs  Lab 04/22/24 1936 04/22/24 2222 04/23/24 0041 04/23/24 0412  NA 125* 121* 131* 133*  K 4.2 4.2 3.6 3.6  CL 87*  --  93* 96*  CO2 18*  --  20* 22  GLUCOSE 520*  --  286* 291*  BUN 52*  --  48* 42*  CREATININE 1.81*  --  1.87* 1.37*  CALCIUM 10.0  --  10.0 9.3  MG  --   --  2.1 1.8  PHOS  --    --  1.7* 3.0   GFR: Estimated Creatinine Clearance: 41.5 mL/min (A) (by C-G formula based on SCr of 1.37 mg/dL (H)). Liver Function Tests: Recent Labs  Lab 04/22/24 1936  AST 16  ALT 24  ALKPHOS 78  BILITOT 1.1  PROT 7.4  ALBUMIN 3.6   Cardiac Enzymes: Recent Labs  Lab 04/23/24 0041  CKTOTAL 85   HbA1C: Recent Labs    04/23/24 0041  HGBA1C 12.0*   CBG: Recent Labs  Lab 04/23/24 0259 04/23/24 0409 04/23/24 0521 04/23/24 0754 04/23/24 0811  GLUCAP 150* 328* 127* 160* 155*   Sepsis Labs: Recent Labs  Lab 04/22/24 1936 04/23/24 0000 04/23/24 0412  LATICACIDVEN 2.7*  5.2* 2.3*    Recent Results (from the past 240 hours)  Blood culture (routine x 2)     Status: None (Preliminary result)   Collection Time: 04/22/24  7:24 PM   Specimen: BLOOD  Result Value Ref Range Status   Specimen Description BLOOD SITE NOT SPECIFIED  Final   Special Requests   Final    BOTTLES DRAWN AEROBIC AND ANAEROBIC Blood Culture results may not be optimal due to an inadequate volume of blood received in culture bottles   Culture   Final    NO GROWTH < 12 HOURS Performed at [facility], [address], [place], [place] [address]    Report Status PENDING  Incomplete  Blood culture (routine x 2)     Status: None (Preliminary result)   Collection Time: 04/22/24  7:36 PM   Specimen: BLOOD  Result Value Ref Range Status   Specimen Description BLOOD LEFT ANTECUBITAL  Final   Special Requests   Final    BOTTLES DRAWN AEROBIC ONLY Blood Culture results may not be optimal due to an inadequate volume of blood received in culture bottles   Culture   Final    NO GROWTH < 12 HOURS Performed at [facility], [address] [place], [place] [address]    Report Status PENDING  Incomplete  Resp panel by RT-PCR (RSV, Flu A&B, Covid) Urine, Clean Catch     Status: None   Collection Time: 04/22/24 10:48 PM   Specimen: Urine, Clean Catch; Nasal Swab  Result Value Ref Range Status    SARS Coronavirus 2 by RT PCR NEGATIVE NEGATIVE Final   Influenza A by PCR NEGATIVE NEGATIVE Final   Influenza B by PCR NEGATIVE NEGATIVE Final    Comment: (NOTE) The Xpert Xpress SARS-CoV-2/FLU/RSV plus assay is intended as an aid in the diagnosis of influenza from Nasopharyngeal swab specimens and should not be used as a sole basis for treatment. Nasal washings and aspirates are unacceptable for Xpert Xpress SARS-CoV-2/FLU/RSV testing.  Fact Sheet for Patients: BloggerCourse.com  Fact Sheet for Healthcare Providers: SeriousBroker.it  This test is not yet approved or cleared by the [place]  FDA and has been authorized for detection and/or diagnosis of SARS-CoV-2 by FDA under an Emergency Use Authorization (EUA). This EUA will remain in effect (meaning this test can be used) for the duration of the COVID-19 declaration under Section 564(b)(1) of the Act, 21 U.S.C. section 360bbb-3(b)(1), unless the authorization is terminated or revoked.     Resp Syncytial Virus by PCR NEGATIVE NEGATIVE Final    Comment: (NOTE) Fact Sheet for Patients: BloggerCourse.com  Fact Sheet for Healthcare Providers: SeriousBroker.it  This test is not yet approved or cleared by the [place]  FDA and has been authorized for detection and/or diagnosis of SARS-CoV-2 by FDA under an Emergency Use Authorization (EUA). This EUA will remain in effect (meaning this test can be used) for the duration of the COVID-19 declaration under Section 564(b)(1) of the Act, 21 U.S.C. section 360bbb-3(b)(1), unless the authorization is terminated or revoked.  Performed at [facility], [address], [place], [place] [address]      Radiology Studies: DG Chest 2 View Result Date: 04/22/2024 EXAM: 2 VIEW(S) XRAY OF THE CHEST 04/22/2024 08:28:00 PM COMPARISON: X-ray 04/26/2021. CLINICAL HISTORY: Tachycardia,  fever at home, dizziness, fatigue, frequent urination. FINDINGS: LUNGS AND PLEURA: Low lung volumes. Bibasilar Atelectasis. Otherwise No focal pulmonary opacity. No pulmonary edema. No pleural effusion. No pneumothorax. HEART AND  MEDIASTINUM: No acute abnormality of the cardiac and mediastinal silhouettes. BONES AND SOFT TISSUES: Thoracic degenerative changes. No acute osseous abnormality. IMPRESSION: 1. No acute findings. Electronically signed by: Norman Gatlin MD 04/22/2024 08:38 PM EDT RP Workstation: [id number]    Scheduled Meds:  insulin  aspart  0-5 Units Subcutaneous QHS   insulin  aspart  0-6 Units Subcutaneous TID WC   insulin  glargine  10 Units Subcutaneous Q24H   pravastatin   40 mg Oral q1800   Continuous Infusions:   LOS: 0 days   Time spent: 55 minutes  Camellia Door, DO  [facility]  04/23/2024, 11:40 AM

## 2024-04-23 NOTE — Plan of Care (Signed)
  Hyperosmolar hyperglycemic state-resolved Non-insulin -dependent DM type II BMP showing normal anion gap and bicarb level.  A1c 12.1.  At home patient is only on metformin. Starting long-acting insulin  10 units and sliding scale SSI.  Stopping the insulin  drip, D5 LR.  Continue maintenance fluid LR 100 cc/h. Given patient is insulin  nave consulting diabetic educator regarding insulin  and diet management. Lactic acid improved to 2.3.  Lactic acidosis in setting of metformin use.

## 2024-04-23 NOTE — Telephone Encounter (Signed)
 Patient Product/process development scientist completed.    The patient is insured through [facility]. Patient has Medicare and is not eligible for a copay card, but may be able to apply for patient assistance or Medicare RX Payment Plan (Patient Must reach out to their plan, if eligible for payment plan), if available.    Ran test claim for Lantus  Pen and the current 30 day co-pay is $35.00.  Ran test claim for Dexcom G7 Sensor and Requires Prior Authorization  Ran test claim for [employer] 3 Plus Sensor and Requires Prior Authorization  This test claim was processed through [employer]- copay amounts may vary at other pharmacies due to [employer], or as the patient moves through the different stages of their insurance plan.     Morgan Arab, CPHT Pharmacy Technician III Certified Patient Advocate [facility] Patient Advocate Team Direct Number: [phone]  Fax: [phone]

## 2024-04-23 NOTE — Discharge Summary (Signed)
 [facility] Phys[facility] Discharge Summary   Patient name: Patrick Stevens  Admit date:     04/22/2024  Discharge date: 04/23/2024  Attending Physician: DOUTOVA, ANASTASSIA [[id number]]  Discharge Physician: Camellia Door   PCP: Ransom Other, MD  Admitted From: Home  Disposition:  Home  Recommendations for Outpatient Follow-up:  Follow up with PCP in 1-2 weeks for insulin  titration PCP to see if pt is a candidate for continued continuous glucose monitoring. DM RN about to give pt sample of freestyle Libre 3 plus sample of 2 sensors.  Home Health:No Equipment/Devices: None    Discharge Condition:Stable CODE STATUS:FULL Diet recommendation: Heart Healthy/Diabetic Fluid Restriction: None  Hospital Summary: CC: Polyuria polydipsia  HPI: Patrick Stevens is a 78 y.o. male with medical history significant of   DM2 hypertension gout, heart murmur   He presented with low-grade, fever, polydipsia, polyuria.  Patient presents with fatigue and dizziness for the past 1 week his primary care provider recently increased his hypertension meds and his gout meds he has been having increasing urination. He attributed it to starting allopurinol and prednisone . his gout symptoms have improved. Reports low-grade fever last night about 100. He Has been having increased thirst and increased urination. No nausea vomiting no diarrhea no abdominal pain. Patient is diabetic. And emergency department found to have blood sugar 520, tachycardic up to 114, lactic acid 2.7. He was given IV fluids started on insulin  drip. He has never been told that he has any kidney disease  Significant Events: Admitted 04/22/2024 for hyperosmolar hyperglycemia state.   Admission Labs: WBC 8.8, HgB 16, plt 225 Na 125, K 4.2, CO2 of 18, BUN 51, Scr 1.81, glu 520 Lactic acid 2.7 VBG pH 7.531, PCO2 of 45 UA glu >500, negative nitrite, negative LE Beta hydroxybutyric acid 0.97 Covid/rsv/flu negative  Admission Imaging  Studies: CXR No acute findings.   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 On lantus  10 units and SSI. DM RN recommends 12 units lantus  and SSI at discharge. I agree. Pharmacy will check on cost of lantus  and short acting insulin (humalog  vs novolog ) pens. DM RN to come back and instruct pt on how to give himself shots. Will discharge pt with home glucose meter. Pt may be a candidate for CGM(continuous glucose monitor). Pt to discuss this with PCP. A1c of 12%. Mean glucose 297  Acute kidney injury superimposed on stage 3a chronic kidney disease (HCC) 04-23-2024 PCP notes document CKD stage 3a. Last Scr from office 12-2023 with scr 1.16. started on IVF overnight. Scr down to 1.37. will hold lisinopril/hydrochlorothiazide at discharge. F/u with PCP next week for repeat BMP prior to restarting ACEI/hydrochlorothiazide.  Elevated troponin 04-23-2024 no CP. Troponins are flat. Echo pending. Due to lactic acidosis and AKI on CKD from HHS. Not demand ischemia.  Hyponatremia 04-23-2024 due to hyperglycemia. Admission Na corrects to 135 taking into account his hyperglycemia(Hiller, 1999)  Lactic acidosis 04-23-2024 admitted with lactic acid of 2.7. given IVF.  Due to HHS.  Essential hypertension 04-23-2024 at home he is on lisinopril/hydrochlorothiazide, norvasc 5 mg. Given AKI on CKD stage 3a, will hold lisinopril/hydrochlorothiazide at discharge. F/u with PCP next week for repeat BMP prior to restarting ACEI/hydrochlorothiazide.  Cardiac murmur 04-23-2024 echo performed. Awaiting final result. He can f/u with PCP office for results.    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 (HHS) (HCC) Active Problems:   Lactic acidosis   Hyponatremia   Elevated troponin   Acute kidney injury superimposed on stage 3a chronic kidney disease (HCC)   Cardiac murmur   Essential hypertension   Discharge Instructions  Discharge Instructions     Call MD for:  difficulty breathing, headache or visual disturbances   Complete by: As directed    Call MD for:  extreme fatigue   Complete by: As directed    Call MD for:  hives   Complete by: As directed    Call MD for:  persistant dizziness or light-headedness   Complete by: As directed    Call MD for:  persistant nausea and vomiting   Complete by: As directed    Call MD for:  redness, tenderness, or signs of infection (pain, swelling, redness, odor or green/yellow discharge around incision site)   Complete by: As directed    Call MD for:  severe uncontrolled pain   Complete by: As directed    Call MD for:  temperature >100.4   Complete by: As directed    Diet - low sodium heart healthy   Complete by: As directed    Diet Carb Modified   Complete by: As directed    Discharge instructions   Complete by: As directed    1. Follow up with your primary care provider in 1-2 weeks following discharge from hospital. 2. Hold lisinopril/hydrochlorothiazide until you are seen by your PCP in follow up clinic. 3. Take your new prescription for insulin  as prescribed.   Increase activity slowly   Complete by: As directed       Allergies as of 04/23/2024   No Known Allergies      Medication List     PAUSE taking these medications    lisinopril-hydrochlorothiazide 20-12.5 MG tablet Wait to take this until your doctor or other care provider tells you to start again. Hold until seen by Dr. Elaine) in follow up appointment before restarting Commonly known as: ZESTORETIC Take 2 tablets by mouth daily.       STOP taking these medications    predniSONE  10 MG  tablet Commonly known as: DELTASONE        TAKE these medications    allopurinol 100 MG tablet Commonly known as: ZYLOPRIM Take 200 mg by mouth daily.   amLODipine 5 MG tablet Commonly known as: NORVASC Take 5 mg by mouth in the morning and at bedtime.   aspirin EC 81 MG tablet Take 81 mg by mouth daily. Swallow whole.   Blood Glucose Monitoring Suppl Devi 1 each by Does not apply route 3 (three) times daily. May dispense any manufacturer covered by patient's insurance.   BLOOD GLUCOSE TEST STRIPS Strp 1 each by Does not apply route 3 (three) times daily. Use as directed to check blood sugar. May dispense any manufacturer covered by patient's insurance and fits patient's device.   indomethacin 50 MG capsule Commonly known as: INDOCIN Take 50 mg by mouth 2 (two) times daily as needed (gout pain).   insulin  glargine 100 UNIT/ML Solostar Pen Commonly known as: LANTUS  Inject 12 Units into the skin daily. May substitute as needed per insurance.   insulin  lispro 100 UNIT/ML KwikPen Commonly known as: HUMALOG  Inject 0-6 Units into the skin 3 (three)  times daily with meals. Check Blood Glucose (BG) and inject per scale: BG <150= 0 unit; BG 150-200= 1 unit; BG 201-250= 2 unit; BG 251-300= 3 unit; BG 301-350= 4 unit; BG 351-400= 5 unit; BG >400= 6 unit and Call Primary Care.   Lancet Device Misc 1 each by Does not apply route 3 (three) times daily. May dispense any manufacturer covered by patient's insurance.   Lancets Misc 1 each by Does not apply route 3 (three) times daily. Use as directed to check blood sugar. May dispense any manufacturer covered by patient's insurance and fits patient's device.   lovastatin 40 MG tablet Commonly known as: MEVACOR Take 40 mg by mouth daily with supper.   metFORMIN 500 MG tablet Commonly known as: GLUCOPHAGE Take 500 mg by mouth daily with supper.   Pen Needles 31G X 5 MM Misc 1 each by Does not apply route 3 (three) times daily. May  dispense any manufacturer covered by patient's insurance.        No Known Allergies  Discharge Exam: Vitals:   04/23/24 1045 04/23/24 1254  BP:  (!) 115/94  Pulse: 97 (!) 103  Resp: 13 16  Temp:  98.6 F (37 C)  SpO2: 94% 100%    Physical Exam Vitals and nursing note reviewed.  Constitutional:      General: He is not in acute distress.    Appearance: He is normal weight. He is not toxic-appearing or diaphoretic.  HENT:     Head: Normocephalic and atraumatic.     Nose: Nose normal.  Eyes:     General: No scleral icterus. Cardiovascular:     Rate and Rhythm: Normal rate and regular rhythm.  Pulmonary:     Effort: Pulmonary effort is normal.     Breath sounds: Normal breath sounds.  Abdominal:     General: Bowel sounds are normal. There is no distension.     Palpations: Abdomen is soft.     Tenderness: There is no abdominal tenderness.  Musculoskeletal:     Right lower leg: No edema.     Left lower leg: No edema.  Skin:    General: Skin is warm and dry.     Capillary Refill: Capillary refill takes less than 2 seconds.  Neurological:     General: No focal deficit present.     Mental Status: He is alert and oriented to person, place, and time.     The results of significant diagnostics from this hospitalization (including imaging, microbiology, ancillary and laboratory) are listed below for reference.    Microbiology: Recent Results (from the past 240 hours)  Blood culture (routine x 2)     Status: None (Preliminary result)   Collection Time: 04/22/24  7:24 PM   Specimen: BLOOD  Result Value Ref Range Status   Specimen Description BLOOD SITE NOT SPECIFIED  Final   Special Requests   Final    BOTTLES DRAWN AEROBIC AND ANAEROBIC Blood Culture results may not be optimal due to an inadequate volume of blood received in culture bottles   Culture   Final    NO GROWTH < 12 HOURS Performed at [facility], [address], [place], [place] [address]    Report  Status PENDING  Incomplete  Blood culture (routine x 2)     Status: None (Preliminary result)   Collection Time: 04/22/24  7:36 PM   Specimen: BLOOD  Result Value Ref Range Status   Specimen Description BLOOD LEFT ANTECUBITAL  Final  Special Requests   Final    BOTTLES DRAWN AEROBIC ONLY Blood Culture results may not be optimal due to an inadequate volume of blood received in culture bottles   Culture   Final    NO GROWTH < 12 HOURS Performed at [facility], [address], [place], [place] [address]    Report Status PENDING  Incomplete  Resp panel by RT-PCR (RSV, Flu A&B, Covid) Urine, Clean Catch     Status: None   Collection Time: 04/22/24 10:48 PM   Specimen: Urine, Clean Catch; Nasal Swab  Result Value Ref Range Status   SARS Coronavirus 2 by RT PCR NEGATIVE NEGATIVE Final   Influenza A by PCR NEGATIVE NEGATIVE Final   Influenza B by PCR NEGATIVE NEGATIVE Final    Comment: (NOTE) The Xpert Xpress SARS-CoV-2/FLU/RSV plus assay is intended as an aid in the diagnosis of influenza from Nasopharyngeal swab specimens and should not be used as a sole basis for treatment. Nasal washings and aspirates are unacceptable for Xpert Xpress SARS-CoV-2/FLU/RSV testing.  Fact Sheet for Patients: BloggerCourse.com  Fact Sheet for Healthcare Providers: SeriousBroker.it  This test is not yet approved or cleared by the [place]  FDA and has been authorized for detection and/or diagnosis of SARS-CoV-2 by FDA under an Emergency Use Authorization (EUA). This EUA will remain in effect (meaning this test can be used) for the duration of the COVID-19 declaration under Section 564(b)(1) of the Act, 21 U.S.C. section 360bbb-3(b)(1), unless the authorization is terminated or revoked.     Resp Syncytial Virus by PCR NEGATIVE NEGATIVE Final    Comment: (NOTE) Fact Sheet for Patients: BloggerCourse.com  Fact Sheet  for Healthcare Providers: SeriousBroker.it  This test is not yet approved or cleared by the [place]  FDA and has been authorized for detection and/or diagnosis of SARS-CoV-2 by FDA under an Emergency Use Authorization (EUA). This EUA will remain in effect (meaning this test can be used) for the duration of the COVID-19 declaration under Section 564(b)(1) of the Act, 21 U.S.C. section 360bbb-3(b)(1), unless the authorization is terminated or revoked.  Performed at [facility], [address] [place], [place] [address]      Labs: Basic Metabolic Panel: Recent Labs  Lab 04/22/24 1936 04/22/24 2222 04/23/24 0041 04/23/24 0412  NA 125* 121* 131* 133*  K 4.2 4.2 3.6 3.6  CL 87*  --  93* 96*  CO2 18*  --  20* 22  GLUCOSE 520*  --  286* 291*  BUN 52*  --  48* 42*  CREATININE 1.81*  --  1.87* 1.37*  CALCIUM 10.0  --  10.0 9.3  MG  --   --  2.1 1.8  PHOS  --   --  1.7* 3.0   Liver Function Tests: Recent Labs  Lab 04/22/24 1936  AST 16  ALT 24  ALKPHOS 78  BILITOT 1.1  PROT 7.4  ALBUMIN 3.6   CBC: Recent Labs  Lab 04/22/24 1936 04/22/24 2222  WBC 8.8  --   NEUTROABS 5.9  --   HGB 16.0 16.0  HCT 47.2 47.0  MCV 79.9*  --   PLT 225  --    Cardiac Enzymes: Recent Labs  Lab 04/23/24 0041  CKTOTAL 85   CBG: Recent Labs  Lab 04/23/24 0409 04/23/24 0521 04/23/24 0754 04/23/24 0811 04/23/24 1243  GLUCAP 328* 127* 160* 155* 274*   Hgb A1c Recent Labs    04/23/24 0041  HGBA1C 12.0*   Urinalysis  Component Value Date/Time   COLORURINE STRAW (A) 04/23/2024 0129   APPEARANCEUR CLEAR 04/23/2024 0129   LABSPEC 1.005 04/23/2024 0129   PHURINE 5.0 04/23/2024 0129   GLUCOSEU >=500 (A) 04/23/2024 0129   HGBUR SMALL (A) 04/23/2024 0129   BILIRUBINUR NEGATIVE 04/23/2024 0129   KETONESUR 5 (A) 04/23/2024 0129   PROTEINUR NEGATIVE 04/23/2024 0129   NITRITE NEGATIVE 04/23/2024 0129   LEUKOCYTESUR NEGATIVE 04/23/2024 0129    Sepsis Labs Recent Labs  Lab 04/22/24 1936  WBC 8.8    Procedures/Studies: ECHOCARDIOGRAM COMPLETE Result Date: 04/23/2024    ECHOCARDIOGRAM REPORT   Patient Name:   Patrick Stevens Date of Exam: 04/23/2024 Medical Rec #:  [id number]          Height:       64.0 in Accession #:    [id number]         Weight:       168.0 lb Date of Birth:  [birth year]           BSA:          1.817 m Patient Age:    78 years           BP:           137/96 mmHg Patient Gender: M                  HR:           66 bpm. Exam Location:  Inpatient Procedure: 2D Echo, Cardiac Doppler, Color Doppler and Intracardiac            Opacification Agent (Both Spectral and Color Flow Doppler were            utilized during procedure). Indications:    Murmur, elevated troponins  History:        Patient has no prior history of Echocardiogram examinations.  Sonographer:    Therisa Crouch Referring Phys: [id number] ANASTASSIA DOUTOVA IMPRESSIONS  1. Left ventricular ejection fraction, by estimation, is 60 to 65%. The left ventricle has normal function. The left ventricle has no regional wall motion abnormalities. There is moderate concentric left ventricular hypertrophy. Left ventricular diastolic parameters are consistent with Grade I diastolic dysfunction (impaired relaxation).  2. Right ventricular systolic function is normal. The right ventricular size is normal.  3. The mitral valve is normal in structure. No evidence of mitral valve regurgitation. No evidence of mitral stenosis.  4. The aortic valve is calcified. There is mild thickening of the aortic valve. Aortic valve regurgitation is not visualized. Moderate aortic valve stenosis. Aortic valve area, by VTI measures 1.05 cm. Aortic valve mean gradient measures 28.3 mmHg. Aortic valve Vmax measures 3.47 m/s.  5. The inferior vena cava is normal in size with greater than 50% respiratory variability, suggesting right atrial pressure of 3 mmHg. FINDINGS  Left Ventricle: Left ventricular ejection  fraction, by estimation, is 60 to 65%. The left ventricle has normal function. The left ventricle has no regional wall motion abnormalities. Definity  contrast agent was given IV to delineate the left ventricular  endocardial borders. The left ventricular internal cavity size was normal in size. There is moderate concentric left ventricular hypertrophy. Left ventricular diastolic parameters are consistent with Grade I diastolic dysfunction (impaired relaxation). Right Ventricle: The right ventricular size is normal. No increase in right ventricular wall thickness. Right ventricular systolic function is normal. Left Atrium: Left atrial size was normal in size. Right Atrium: Right atrial size was normal in size. Pericardium: There is  no evidence of pericardial effusion. Mitral Valve: The mitral valve is normal in structure. Mild mitral annular calcification. No evidence of mitral valve regurgitation. No evidence of mitral valve stenosis. Tricuspid Valve: The tricuspid valve is normal in structure. Tricuspid valve regurgitation is not demonstrated. No evidence of tricuspid stenosis. Aortic Valve: The aortic valve is calcified. There is mild thickening of the aortic valve. There is mild aortic valve annular calcification. Aortic valve regurgitation is not visualized. Moderate aortic stenosis is present. Aortic valve mean gradient measures 28.3 mmHg. Aortic valve peak gradient measures 48.2 mmHg. Aortic valve area, by VTI measures 1.05 cm. Pulmonic Valve: The pulmonic valve was normal in structure. Pulmonic valve regurgitation is not visualized. No evidence of pulmonic stenosis. Aorta: The aortic root is normal in size and structure. Venous: The inferior vena cava is normal in size with greater than 50% respiratory variability, suggesting right atrial pressure of 3 mmHg. IAS/Shunts: No atrial level shunt detected by color flow Doppler.  LEFT VENTRICLE PLAX 2D LVIDd:         3.50 cm   Diastology LVIDs:         2.10 cm    LV e' medial:  5.55 cm/s LV PW:         1.40 cm   LV e' lateral: 4.03 cm/s LV IVS:        1.40 cm LVOT diam:     2.00 cm LV SV:         59 LV SV Index:   33 LVOT Area:     3.14 cm  RIGHT VENTRICLE             IVC RV Basal diam:  3.10 cm     IVC diam: 1.10 cm RV S prime:     18.00 cm/s TAPSE (M-mode): 2.5 cm LEFT ATRIUM             Index LA diam:        2.70 cm 1.49 cm/m LA Vol (A2C):   40.7 ml 22.40 ml/m LA Vol (A4C):   43.4 ml 23.89 ml/m LA Biplane Vol: 46.1 ml 25.38 ml/m  AORTIC VALVE AV Area (Vmax):    1.01 cm AV Area (Vmean):   1.05 cm AV Area (VTI):     1.05 cm AV Vmax:           347.00 cm/s AV Vmean:          247.333 cm/s AV VTI:            0.567 m AV Peak Grad:      48.2 mmHg AV Mean Grad:      28.3 mmHg LVOT Vmax:         112.00 cm/s LVOT Vmean:        82.350 cm/s LVOT VTI:          0.189 m LVOT/AV VTI ratio: 0.33  AORTA Ao Root diam: 2.80 cm Ao Asc diam:  3.00 cm  SHUNTS Systemic VTI:  0.19 m Systemic Diam: 2.00 cm Dub Tobb DO Electronically signed by Dub Huntsman DO Signature Date/Time: 04/23/2024/12:32:33 PM    Final    DG Chest 2 View Result Date: 04/22/2024 EXAM: 2 VIEW(S) XRAY OF THE CHEST 04/22/2024 08:28:00 PM COMPARISON: X-ray 04/26/2021. CLINICAL HISTORY: Tachycardia, fever at home, dizziness, fatigue, frequent urination. FINDINGS: LUNGS AND PLEURA: Low lung volumes. Bibasilar Atelectasis. Otherwise No focal pulmonary opacity. No pulmonary edema. No pleural effusion. No pneumothorax. HEART AND MEDIASTINUM: No acute abnormality of the cardiac and  mediastinal silhouettes. BONES AND SOFT TISSUES: Thoracic degenerative changes. No acute osseous abnormality. IMPRESSION: 1. No acute findings. Electronically signed by: Norman Gatlin MD 04/22/2024 08:38 PM EDT RP Workstation: [id number]    Time coordinating discharge: 55 mins  SIGNED:  Camellia Door, DO [facility] 04/23/24, 1:53 PM

## 2024-04-23 NOTE — Assessment & Plan Note (Signed)
 04-23-2024 due to hyperglycemia. Admission Na corrects to 135 taking into account his hyperglycemia(Hiller, 1999)

## 2024-04-23 NOTE — Hospital Course (Addendum)
 CC: Polyuria polydipsia  HPI: Patrick Stevens is a 78 y.o. male with medical history significant of   DM2 hypertension gout, heart murmur   He presented with low-grade, fever, polydipsia, polyuria.  Patient presents with fatigue and dizziness for the past 1 week his primary care provider recently increased his hypertension meds and his gout meds he has been having increasing urination. He attributed it to starting allopurinol and prednisone . his gout symptoms have improved. Reports low-grade fever last night about 100. He Has been having increased thirst and increased urination. No nausea vomiting no diarrhea no abdominal pain. Patient is diabetic. And emergency department found to have blood sugar 520, tachycardic up to 114, lactic acid 2.7. He was given IV fluids started on insulin  drip. He has never been told that he has any kidney disease  Significant Events: Admitted 04/22/2024 for hyperosmolar hyperglycemia state.   Admission Labs: WBC 8.8, HgB 16, plt 225 Na 125, K 4.2, CO2 of 18, BUN 51, Scr 1.81, glu 520 Lactic acid 2.7 VBG pH 7.531, PCO2 of 45 UA glu >500, negative nitrite, negative LE Beta hydroxybutyric acid 0.97 Covid/rsv/flu negative  Admission Imaging Studies: CXR No acute findings.   Significant Labs: HgA1c 12%  Significant Imaging Studies:   Antibiotic Therapy: Anti-infectives (From admission, onward)    None       Procedures:   Consultants: Nurse diabetes educator

## 2024-04-23 NOTE — Assessment & Plan Note (Addendum)
 04-23-2024 transitioned to SQ insulin  last night. On lantus  10 units and SSI. DM RN recommends 12 units lantus  and SSI at discharge. I agree. Pharmacy will check on cost of lantus  and short acting insulin (humalog  vs novolog ) pens. DM RN to come back and instruct pt on how to give himself shots. Will discharge pt with home glucose meter. Pt may be a candidate for CGM(continuous glucose monitor). Pt to discuss this with PCP. A1c of 12%. Mean glucose 297

## 2024-04-23 NOTE — Assessment & Plan Note (Signed)
 04-23-2024 PCP notes document CKD stage 3a. Last Scr from office 12-2023 with scr 1.16. started on IVF overnight. Scr down to 1.37. will hold lisinopril/hydrochlorothiazide at discharge. F/u with PCP next week for repeat BMP prior to restarting ACEI/hydrochlorothiazide.

## 2024-04-23 NOTE — ED Notes (Signed)
 CCMD discontinued due to pt being discharged.

## 2024-04-23 NOTE — Assessment & Plan Note (Signed)
 04-23-2024 admitted with lactic acid of 2.7. given IVF.  Due to HHS.

## 2024-04-24 ENCOUNTER — Emergency Department (HOSPITAL_COMMUNITY): Admission: EM | Admit: 2024-04-24 | Discharge: 2024-04-25 | Disposition: A | Discharge status: Home / Self Care

## 2024-04-24 ENCOUNTER — Other Ambulatory Visit: Payer: Self-pay

## 2024-04-24 DIAGNOSIS — E1110 Type 2 diabetes mellitus with ketoacidosis without coma: Secondary | ICD-10-CM | POA: Insufficient documentation

## 2024-04-24 DIAGNOSIS — T383X1A Poisoning by insulin and oral hypoglycemic [antidiabetic] drugs, accidental (unintentional), initial encounter: Secondary | ICD-10-CM | POA: Diagnosis not present

## 2024-04-24 DIAGNOSIS — Z794 Long term (current) use of insulin: Secondary | ICD-10-CM | POA: Diagnosis not present

## 2024-04-24 DIAGNOSIS — Z7984 Long term (current) use of oral hypoglycemic drugs: Secondary | ICD-10-CM | POA: Diagnosis not present

## 2024-04-24 DIAGNOSIS — Z7982 Long term (current) use of aspirin: Secondary | ICD-10-CM | POA: Insufficient documentation

## 2024-04-24 DIAGNOSIS — R42 Dizziness and giddiness: Secondary | ICD-10-CM

## 2024-04-24 DIAGNOSIS — T50901A Poisoning by unspecified drugs, medicaments and biological substances, accidental (unintentional), initial encounter: Secondary | ICD-10-CM

## 2024-04-24 DIAGNOSIS — X58XXXA Exposure to other specified factors, initial encounter: Secondary | ICD-10-CM | POA: Diagnosis not present

## 2024-04-24 DIAGNOSIS — E11649 Type 2 diabetes mellitus with hypoglycemia without coma: Secondary | ICD-10-CM | POA: Diagnosis not present

## 2024-04-24 LAB — I-STAT CHEM 8, ED
BUN: 46 mg/dL — ABNORMAL HIGH (ref 8–23)
Calcium, Ion: 1.17 mmol/L (ref 1.15–1.40)
Chloride: 97 mmol/L — ABNORMAL LOW (ref 98–111)
Creatinine, Ser: 2.1 mg/dL — ABNORMAL HIGH (ref 0.61–1.24)
Glucose, Bld: 311 mg/dL — ABNORMAL HIGH (ref 70–99)
HCT: 43 % (ref 39.0–52.0)
Hemoglobin: 14.6 g/dL (ref 13.0–17.0)
Potassium: 3.6 mmol/L (ref 3.5–5.1)
Sodium: 131 mmol/L — ABNORMAL LOW (ref 135–145)
TCO2: 23 mmol/L (ref 22–32)

## 2024-04-24 LAB — CBC WITH DIFFERENTIAL/PLATELET
Abs Immature Granulocytes: 0.04 K/uL (ref 0.00–0.07)
Basophils Absolute: 0 K/uL (ref 0.0–0.1)
Basophils Relative: 0 %
Eosinophils Absolute: 0.1 K/uL (ref 0.0–0.5)
Eosinophils Relative: 1 %
HCT: 41.6 % (ref 39.0–52.0)
Hemoglobin: 14 g/dL (ref 13.0–17.0)
Immature Granulocytes: 0 %
Lymphocytes Relative: 15 %
Lymphs Abs: 1.8 K/uL (ref 0.7–4.0)
MCH: 26.9 pg (ref 26.0–34.0)
MCHC: 33.7 g/dL (ref 30.0–36.0)
MCV: 80 fL (ref 80.0–100.0)
Monocytes Absolute: 0.9 K/uL (ref 0.1–1.0)
Monocytes Relative: 7 %
Neutro Abs: 9.2 K/uL — ABNORMAL HIGH (ref 1.7–7.7)
Neutrophils Relative %: 77 %
Platelets: 195 K/uL (ref 150–400)
RBC: 5.2 MIL/uL (ref 4.22–5.81)
RDW: 14 % (ref 11.5–15.5)
WBC: 11.9 K/uL — ABNORMAL HIGH (ref 4.0–10.5)
nRBC: 0 % (ref 0.0–0.2)

## 2024-04-24 LAB — CBG MONITORING, ED: Glucose-Capillary: 221 mg/dL — ABNORMAL HIGH (ref 70–99)

## 2024-04-24 LAB — TROPONIN I (HIGH SENSITIVITY): Troponin I (High Sensitivity): 23 ng/L — ABNORMAL HIGH (ref <18)

## 2024-04-24 MED ORDER — SODIUM CHLORIDE 0.9 % IV BOLUS
500.0000 mL | Freq: Once | INTRAVENOUS | Status: AC
Start: 2024-04-24 — End: 2024-04-25
  Administered 2024-04-24: 500 mL via INTRAVENOUS

## 2024-04-24 NOTE — ED Triage Notes (Signed)
 PT HAS NEW INSULIN  AND WIFE ADMINISTERED IT TODAY. PER FAMILY WIFE GAVE 60 UNITS OF LANTUS  INSTEAD OF 12. THIS HAPPENED AT 1400. PT STATES HE FEELS [facility]

## 2024-04-24 NOTE — ED Provider Notes (Signed)
 [place] EMERGENCY DEPARTMENT AT [facility] Provider Note   CSN: [id number] Arrival date & time: 04/24/24  1837     Patient presents with: Hypotension and Hypoglycemia   Patrick Stevens is a 78 y.o. male.   78 year old male presents for evaluation of weakness.  Wife gave him too much of his Lantus  accidentally today.  He was just discharged after being admitted for new onset diabetes and DKA.  She gave him 6 units of Lantus  instead of 12.  States he feels shaky and somewhat weak.  This happened around 2 PM.  Patient denies any other symptoms or concerns at this time.   Hypoglycemia Associated symptoms: weakness   Associated symptoms: no seizures, no shortness of breath and no vomiting        Prior to Admission medications   Medication Sig Start Date End Date Taking? Authorizing Provider  allopurinol (ZYLOPRIM) 100 MG tablet Take 200 mg by mouth daily. 03/27/21   [provider]  amLODipine (NORVASC) 5 MG tablet Take 5 mg by mouth in the morning and at bedtime.    [provider]  aspirin EC 81 MG tablet Take 81 mg by mouth daily. Swallow whole.    [provider]  Blood Glucose Monitoring Suppl DEVI 1 each by Does not apply route 3 (three) times daily. May dispense any manufacturer covered by patient's insurance. 04/23/24   Laurence Locus, DO  Glucose Blood (BLOOD GLUCOSE TEST STRIPS) STRP 1 each by Does not apply route 3 (three) times daily. Use as directed to check blood sugar. May dispense any manufacturer covered by patient's insurance and fits patient's device. 04/23/24   Laurence Locus, DO  indomethacin (INDOCIN) 50 MG capsule Take 50 mg by mouth 2 (two) times daily as needed (gout pain).    [provider]  insulin  glargine (LANTUS ) 100 UNIT/ML Solostar Pen Inject 12 Units into the skin daily. May substitute as needed per insurance. 04/23/24 08/26/24  Laurence Locus, DO  insulin  lispro (HUMALOG ) 100 UNIT/ML KwikPen Inject 0-6 Units into the  skin 3 (three) times daily with meals. Check Blood Glucose (BG) and inject per scale: BG <150= 0 unit; BG 150-200= 1 unit; BG 201-250= 2 unit; BG 251-300= 3 unit; BG 301-350= 4 unit; BG 351-400= 5 unit; BG >400= 6 unit and Call Primary Care. 04/23/24   Laurence Locus, DO  Insulin  Pen Needle (PEN NEEDLES) 31G X 5 MM MISC 1 each by Does not apply route 3 (three) times daily. May dispense any manufacturer covered by patient's insurance. 04/23/24   Laurence Locus, DO  Lancet Device MISC 1 each by Does not apply route 3 (three) times daily. May dispense any manufacturer covered by patient's insurance. 04/23/24   Laurence Locus, DO  Lancets MISC 1 each by Does not apply route 3 (three) times daily. Use as directed to check blood sugar. May dispense any manufacturer covered by patient's insurance and fits patient's device. 04/23/24   Laurence Locus, DO  lisinopril-hydrochlorothiazide (ZESTORETIC) 20-12.5 MG tablet Take 2 tablets by mouth daily.    [provider]  lovastatin (MEVACOR) 40 MG tablet Take 40 mg by mouth daily with supper.    [provider]  metFORMIN (GLUCOPHAGE) 500 MG tablet Take 500 mg by mouth daily with supper.    [provider]    Allergies: Patient has no known allergies.    Review of Systems  Constitutional:  Negative for chills and fever.  HENT:  Negative for ear pain and sore  throat.   Eyes:  Negative for pain and visual disturbance.  Respiratory:  Negative for cough and shortness of breath.   Cardiovascular:  Negative for chest pain and palpitations.  Gastrointestinal:  Negative for abdominal pain and vomiting.  Genitourinary:  Negative for dysuria and hematuria.  Musculoskeletal:  Negative for arthralgias and back pain.  Skin:  Negative for color change and rash.  Neurological:  Positive for weakness. Negative for seizures and syncope.  All other systems reviewed and are negative.   Updated Vital Signs BP 124/78   Pulse (!) 103   Temp 97.8 F (36.6 C)   Resp  16   Ht 5' 4 (1.626 m)   Wt 84.4 kg   SpO2 100%   BMI 31.93 kg/m   Physical Exam Vitals and nursing note reviewed.  Constitutional:      General: He is not in acute distress.    Appearance: Normal appearance. He is well-developed. He is not ill-appearing.  HENT:     Head: Normocephalic and atraumatic.  Eyes:     Conjunctiva/sclera: Conjunctivae normal.  Cardiovascular:     Rate and Rhythm: Normal rate and regular rhythm.     Heart sounds: No murmur heard. Pulmonary:     Effort: Pulmonary effort is normal. No respiratory distress.     Breath sounds: Normal breath sounds.  Abdominal:     Palpations: Abdomen is soft.     Tenderness: There is no abdominal tenderness.  Musculoskeletal:        General: No swelling.     Cervical back: Neck supple.  Skin:    General: Skin is warm and dry.     Capillary Refill: Capillary refill takes less than 2 seconds.  Neurological:     Mental Status: He is alert.  Psychiatric:        Mood and Affect: Mood normal.     (all labs ordered are listed, but only abnormal results are displayed) Labs Reviewed  CBC WITH DIFFERENTIAL/PLATELET - Abnormal; Notable for the following components:      Result Value   WBC 11.9 (*)    Neutro Abs 9.2 (*)    All other components within normal limits  I-STAT CHEM 8, ED - Abnormal; Notable for the following components:   Sodium 131 (*)    Chloride 97 (*)    BUN 46 (*)    Creatinine, Ser 2.10 (*)    Glucose, Bld 311 (*)    All other components within normal limits  TROPONIN I (HIGH SENSITIVITY) - Abnormal; Notable for the following components:   Troponin I (High Sensitivity) 23 (*)    All other components within normal limits  CBG MONITORING, ED  TROPONIN I (HIGH SENSITIVITY)    EKG: EKG Interpretation Date/Time:  Friday April 24 2024 21:13:18 EDT Ventricular Rate:  91 PR Interval:  216 QRS Duration:  109 QT Interval:  362 QTC Calculation: 446 R Axis:   57  Text Interpretation: Sinus  rhythm Prolonged PR interval ST segment changes in the anterior leads with some T wave inversions/depressions in inferior and lateral leads. This is unchanged when compared to EKG from 04/22/2024 Confirmed by Gennaro Bouchard ([id number]) on 04/24/2024 10:00:00 PM  Radiology: ECHOCARDIOGRAM COMPLETE Result Date: 04/23/2024    ECHOCARDIOGRAM REPORT   Patient Name:   Patrick Stevens Date of Exam: 04/23/2024 Medical Rec #:  [id number]          Height:       64.0 in Accession #:  [id number]         Weight:       168.0 lb Date of Birth:  [birth year]           BSA:          1.817 m Patient Age:    84 years           BP:           137/96 mmHg Patient Gender: M                  HR:           66 bpm. Exam Location:  Inpatient Procedure: 2D Echo, Cardiac Doppler, Color Doppler and Intracardiac            Opacification Agent (Both Spectral and Color Flow Doppler were            utilized during procedure). Indications:    Murmur, elevated troponins  History:        Patient has no prior history of Echocardiogram examinations.  Sonographer:    Therisa Crouch Referring Phys: [id number] ANASTASSIA DOUTOVA IMPRESSIONS  1. Left ventricular ejection fraction, by estimation, is 60 to 65%. The left ventricle has normal function. The left ventricle has no regional wall motion abnormalities. There is moderate concentric left ventricular hypertrophy. Left ventricular diastolic parameters are consistent with Grade I diastolic dysfunction (impaired relaxation).  2. Right ventricular systolic function is normal. The right ventricular size is normal.  3. The mitral valve is normal in structure. No evidence of mitral valve regurgitation. No evidence of mitral stenosis.  4. The aortic valve is calcified. There is mild thickening of the aortic valve. Aortic valve regurgitation is not visualized. Moderate aortic valve stenosis. Aortic valve area, by VTI measures 1.05 cm. Aortic valve mean gradient measures 28.3 mmHg. Aortic valve Vmax measures 3.47 m/s.  5.  The inferior vena cava is normal in size with greater than 50% respiratory variability, suggesting right atrial pressure of 3 mmHg. FINDINGS  Left Ventricle: Left ventricular ejection fraction, by estimation, is 60 to 65%. The left ventricle has normal function. The left ventricle has no regional wall motion abnormalities. Definity  contrast agent was given IV to delineate the left ventricular  endocardial borders. The left ventricular internal cavity size was normal in size. There is moderate concentric left ventricular hypertrophy. Left ventricular diastolic parameters are consistent with Grade I diastolic dysfunction (impaired relaxation). Right Ventricle: The right ventricular size is normal. No increase in right ventricular wall thickness. Right ventricular systolic function is normal. Left Atrium: Left atrial size was normal in size. Right Atrium: Right atrial size was normal in size. Pericardium: There is no evidence of pericardial effusion. Mitral Valve: The mitral valve is normal in structure. Mild mitral annular calcification. No evidence of mitral valve regurgitation. No evidence of mitral valve stenosis. Tricuspid Valve: The tricuspid valve is normal in structure. Tricuspid valve regurgitation is not demonstrated. No evidence of tricuspid stenosis. Aortic Valve: The aortic valve is calcified. There is mild thickening of the aortic valve. There is mild aortic valve annular calcification. Aortic valve regurgitation is not visualized. Moderate aortic stenosis is present. Aortic valve mean gradient measures 28.3 mmHg. Aortic valve peak gradient measures 48.2 mmHg. Aortic valve area, by VTI measures 1.05 cm. Pulmonic Valve: The pulmonic valve was normal in structure. Pulmonic valve regurgitation is not visualized. No evidence of pulmonic stenosis. Aorta: The aortic root is normal in size and structure. Venous: The inferior vena cava is normal  in size with greater than 50% respiratory variability, suggesting  right atrial pressure of 3 mmHg. IAS/Shunts: No atrial level shunt detected by color flow Doppler.  LEFT VENTRICLE PLAX 2D LVIDd:         3.50 cm   Diastology LVIDs:         2.10 cm   LV e' medial:  5.55 cm/s LV PW:         1.40 cm   LV e' lateral: 4.03 cm/s LV IVS:        1.40 cm LVOT diam:     2.00 cm LV SV:         59 LV SV Index:   33 LVOT Area:     3.14 cm  RIGHT VENTRICLE             IVC RV Basal diam:  3.10 cm     IVC diam: 1.10 cm RV S prime:     18.00 cm/s TAPSE (M-mode): 2.5 cm LEFT ATRIUM             Index LA diam:        2.70 cm 1.49 cm/m LA Vol (A2C):   40.7 ml 22.40 ml/m LA Vol (A4C):   43.4 ml 23.89 ml/m LA Biplane Vol: 46.1 ml 25.38 ml/m  AORTIC VALVE AV Area (Vmax):    1.01 cm AV Area (Vmean):   1.05 cm AV Area (VTI):     1.05 cm AV Vmax:           347.00 cm/s AV Vmean:          247.333 cm/s AV VTI:            0.567 m AV Peak Grad:      48.2 mmHg AV Mean Grad:      28.3 mmHg LVOT Vmax:         112.00 cm/s LVOT Vmean:        82.350 cm/s LVOT VTI:          0.189 m LVOT/AV VTI ratio: 0.33  AORTA Ao Root diam: 2.80 cm Ao Asc diam:  3.00 cm  SHUNTS Systemic VTI:  0.19 m Systemic Diam: 2.00 cm Kardie Tobb DO Electronically signed by Dub Huntsman DO Signature Date/Time: 04/23/2024/12:32:33 PM    Final      Procedures   Medications Ordered in the ED  sodium chloride  0.9 % bolus 500 mL (500 mLs Intravenous New Bag/Given 04/24/24 2122)                                    Medical Decision Making Cardiac monitor interpretation: Sinus tachycardia, no ectopy  Social determinants of health: Patient is having difficult time understanding medications  Patient here for accidental injection of too much of his Lantus  by his wife with return to medications earlier today.  His glucose has been stable.  Your first troponin is slightly elevated but lower than previous.  Labs otherwise unremarkable and he is feeling well after IV fluids.  Vitals are stable and he is able to eat and drink without  difficulty.  Second troponin and second glucose are pending at this time.  Plan will be to discharge him home if t leukosis stable troponin is flat.  Patient signed out to oncoming provider at 11 PM pending ultimate disposition.  Problems Addressed: Accidental medication error, initial encounter: acute illness or injury Lightheadedness: acute illness or injury  Amount and/or Complexity of Data Reviewed External Data Reviewed: notes.    Details: Prior hospital records reviewed and patient discharged yesterday after being admitted for DKA 2 days ago Labs: ordered. Decision-making details documented in ED Course.    Details: Ordered and reviewed by me and unremarkable, first troponin is elevated but he has a troponin to get baseline at this lower than it was when he was here 2 days ago.  Glucose he is pending ECG/medicine tests: ordered and independent interpretation performed. Decision-making details documented in ED Course.    Details: Ordered and interpreted by me in the absence of cardiology and shows sinus rhythm, patient does have ST and T wave abnormalities that are chronic and unchanged when compared to prior EKG  Risk OTC drugs. Prescription drug management. Drug therapy requiring intensive monitoring for toxicity. Diagnosis or treatment significantly limited by social determinants of health.     Final diagnoses:  Lightheadedness  Accidental medication error, initial encounter    ED Discharge Orders     None          Gennaro Duwaine CROME, DO 04/24/24 2308

## 2024-04-24 NOTE — ED Notes (Signed)
 Pt POCT BG 319

## 2024-04-24 NOTE — ED Notes (Signed)
 Pt reports getting new insulin  medication today after metformin was discontinued. Family member accidentally gave 60 units of lantus  instead of ordered 12 units. Unsure if lispro was given or not, family reports if I did only 6 units. Pt BG 319 on arrival, reports feeling shaky. Denies chest pain or SOB.

## 2024-04-25 LAB — TROPONIN I (HIGH SENSITIVITY): Troponin I (High Sensitivity): 20 ng/L — ABNORMAL HIGH (ref <18)

## 2024-04-25 NOTE — Discharge Instructions (Signed)
 Make sure that you are using your insulin  as prescribed.  Eat small frequent meals to prevent low blood sugars.

## 2024-04-25 NOTE — ED Provider Notes (Signed)
 Patient signed out pending repeat CBG and troponin.  In brief presented with taking an increased dose of insulin  in error.  He has not had any evidence or episodes of hypoglycemia.  Last CBG 221.  Repeat troponin 20.  Overall workup is at patient's baseline.  He had his wife were educated by prior provider regarding insulin  administration.   Bari Charmaine FALCON, MD 04/25/24 BEATRIS

## 2024-04-27 LAB — CULTURE, BLOOD (ROUTINE X 2)
Culture: NO GROWTH
Culture: NO GROWTH

## 2024-04-28 DIAGNOSIS — M109 Gout, unspecified: Secondary | ICD-10-CM | POA: Diagnosis not present

## 2024-04-28 DIAGNOSIS — E1169 Type 2 diabetes mellitus with other specified complication: Secondary | ICD-10-CM | POA: Diagnosis not present

## 2024-05-12 DIAGNOSIS — I10 Essential (primary) hypertension: Secondary | ICD-10-CM | POA: Diagnosis not present

## 2024-05-12 DIAGNOSIS — I350 Nonrheumatic aortic (valve) stenosis: Secondary | ICD-10-CM | POA: Diagnosis not present

## 2024-05-12 DIAGNOSIS — N182 Chronic kidney disease, stage 2 (mild): Secondary | ICD-10-CM | POA: Diagnosis not present

## 2024-05-12 DIAGNOSIS — E782 Mixed hyperlipidemia: Secondary | ICD-10-CM | POA: Diagnosis not present

## 2024-05-21 DIAGNOSIS — N182 Chronic kidney disease, stage 2 (mild): Secondary | ICD-10-CM | POA: Diagnosis not present

## 2024-05-21 DIAGNOSIS — Z794 Long term (current) use of insulin: Secondary | ICD-10-CM | POA: Diagnosis not present

## 2024-05-21 DIAGNOSIS — E1122 Type 2 diabetes mellitus with diabetic chronic kidney disease: Secondary | ICD-10-CM | POA: Diagnosis not present

## 2024-05-21 DIAGNOSIS — I10 Essential (primary) hypertension: Secondary | ICD-10-CM | POA: Diagnosis not present

## 2024-05-25 DIAGNOSIS — M1A9XX1 Chronic gout, unspecified, with tophus (tophi): Secondary | ICD-10-CM | POA: Diagnosis not present

## 2024-05-25 DIAGNOSIS — M79671 Pain in right foot: Secondary | ICD-10-CM | POA: Diagnosis not present

## 2024-05-25 DIAGNOSIS — M79672 Pain in left foot: Secondary | ICD-10-CM | POA: Diagnosis not present

## 2024-05-25 DIAGNOSIS — M79642 Pain in left hand: Secondary | ICD-10-CM | POA: Diagnosis not present

## 2024-05-25 DIAGNOSIS — E1169 Type 2 diabetes mellitus with other specified complication: Secondary | ICD-10-CM | POA: Diagnosis not present

## 2024-05-25 DIAGNOSIS — Z79899 Other long term (current) drug therapy: Secondary | ICD-10-CM | POA: Diagnosis not present

## 2024-05-25 DIAGNOSIS — M79641 Pain in right hand: Secondary | ICD-10-CM | POA: Diagnosis not present

## 2024-05-25 DIAGNOSIS — M7989 Other specified soft tissue disorders: Secondary | ICD-10-CM | POA: Diagnosis not present

## 2024-05-25 DIAGNOSIS — M79643 Pain in unspecified hand: Secondary | ICD-10-CM | POA: Diagnosis not present

## 2024-05-25 DIAGNOSIS — E785 Hyperlipidemia, unspecified: Secondary | ICD-10-CM | POA: Diagnosis not present

## 2024-05-25 DIAGNOSIS — M25572 Pain in left ankle and joints of left foot: Secondary | ICD-10-CM | POA: Diagnosis not present

## 2024-05-25 DIAGNOSIS — M25571 Pain in right ankle and joints of right foot: Secondary | ICD-10-CM | POA: Diagnosis not present

## 2024-05-25 DIAGNOSIS — M25471 Effusion, right ankle: Secondary | ICD-10-CM | POA: Diagnosis not present
